# Patient Record
Sex: Male | Born: 1955
Health system: Southern US, Community
[De-identification: ages and names within clinical notes are randomized; demographics above are authoritative.]

## PROBLEM LIST (undated history)

## (undated) DIAGNOSIS — N200 Calculus of kidney: Secondary | ICD-10-CM

## (undated) HISTORY — PX: APPENDECTOMY: SHX54

## (undated) HISTORY — PX: BACK SURGERY: SHX140

---

## 2015-08-31 DIAGNOSIS — R030 Elevated blood-pressure reading, without diagnosis of hypertension: Secondary | ICD-10-CM | POA: Insufficient documentation

## 2015-08-31 DIAGNOSIS — I10 Essential (primary) hypertension: Secondary | ICD-10-CM | POA: Insufficient documentation

## 2017-08-29 DIAGNOSIS — K429 Umbilical hernia without obstruction or gangrene: Secondary | ICD-10-CM | POA: Insufficient documentation

## 2017-08-29 DIAGNOSIS — N200 Calculus of kidney: Secondary | ICD-10-CM | POA: Insufficient documentation

## 2017-09-26 DIAGNOSIS — E782 Mixed hyperlipidemia: Secondary | ICD-10-CM | POA: Insufficient documentation

## 2019-09-01 DIAGNOSIS — Z23 Encounter for immunization: Secondary | ICD-10-CM | POA: Diagnosis not present

## 2019-09-24 DIAGNOSIS — Z23 Encounter for immunization: Secondary | ICD-10-CM | POA: Diagnosis not present

## 2020-04-06 DIAGNOSIS — Z23 Encounter for immunization: Secondary | ICD-10-CM | POA: Diagnosis not present

## 2020-05-16 ENCOUNTER — Encounter: Payer: Self-pay | Admitting: Emergency Medicine

## 2020-05-16 ENCOUNTER — Emergency Department (INDEPENDENT_AMBULATORY_CARE_PROVIDER_SITE_OTHER): Payer: BC Managed Care – PPO

## 2020-05-16 ENCOUNTER — Emergency Department (INDEPENDENT_AMBULATORY_CARE_PROVIDER_SITE_OTHER)
Admission: EM | Admit: 2020-05-16 | Discharge: 2020-05-16 | Disposition: A | Discharge status: Home / Self Care | Payer: BC Managed Care – PPO | Source: Home / Self Care | Attending: Internal Medicine | Admitting: Internal Medicine

## 2020-05-16 ENCOUNTER — Other Ambulatory Visit: Payer: Self-pay

## 2020-05-16 DIAGNOSIS — K5900 Constipation, unspecified: Secondary | ICD-10-CM | POA: Diagnosis not present

## 2020-05-16 DIAGNOSIS — K529 Noninfective gastroenteritis and colitis, unspecified: Secondary | ICD-10-CM

## 2020-05-16 DIAGNOSIS — R1030 Lower abdominal pain, unspecified: Secondary | ICD-10-CM

## 2020-05-16 DIAGNOSIS — R1012 Left upper quadrant pain: Secondary | ICD-10-CM | POA: Diagnosis not present

## 2020-05-16 DIAGNOSIS — R109 Unspecified abdominal pain: Secondary | ICD-10-CM | POA: Diagnosis not present

## 2020-05-16 HISTORY — DX: Calculus of kidney: N20.0

## 2020-05-16 MED ORDER — POLYETHYLENE GLYCOL 3350 17 G PO PACK: 17.0000 g | PACK | Freq: Every day | ORAL | 0 refills | Status: DC | PRN

## 2020-05-16 MED ORDER — METRONIDAZOLE 500 MG PO TABS: 500.0000 mg | ORAL_TABLET | Freq: Three times a day (TID) | ORAL | 0 refills | Status: AC

## 2020-05-16 MED ORDER — SENNOSIDES-DOCUSATE SODIUM 8.6-50 MG PO TABS: 1.0000 | ORAL_TABLET | Freq: Two times a day (BID) | ORAL | 0 refills | Status: DC

## 2020-05-16 MED ORDER — CIPROFLOXACIN HCL 500 MG PO TABS: 500.0000 mg | ORAL_TABLET | Freq: Two times a day (BID) | ORAL | 0 refills | Status: AC

## 2020-05-16 MED ORDER — ONDANSETRON 4 MG PO TBDP: 4.0000 mg | ORAL_TABLET | Freq: Three times a day (TID) | ORAL | 0 refills | Status: DC | PRN

## 2020-05-16 NOTE — ED Provider Notes (Signed)
[name]    CSN: [id number] Arrival date & time: 05/16/20  8756      History   Chief Complaint Chief Complaint  Patient presents with   Flank Pain    HPI Jake Huber is a 64 y.o. male comes to urgent care with left upper quadrant pain of 4 days duration.  Patient describes the pain as sharp/throbbing and localized in the left upper quadrant area.  No known relieving factors.  Last bowel movement was 3 to 4 days ago.  No diarrhea.  Stool was normal consistency.  No abdominal distention.  Patient has had some nausea and nonbilious nonbloody vomiting a couple of times.   Last colonoscopy was several years ago.  HPI  Past Medical History:  Diagnosis Date   Kidney stones     There are no problems to display for this patient.   Past Surgical History:  Procedure Laterality Date   APPENDECTOMY     BACK SURGERY         Home Medications    Prior to Admission medications   Medication Sig Start Date End Date Taking? Authorizing Provider  acetaminophen (TYLENOL) 325 MG tablet Take 650 mg by mouth every 6 (six) hours as needed.    [provider]  ciprofloxacin (CIPRO) 500 MG tablet Take 1 tablet (500 mg total) by mouth every 12 (twelve) hours for 7 days. 05/16/20 05/23/20  Merrilee Jansky, MD  metroNIDAZOLE (FLAGYL) 500 MG tablet Take 1 tablet (500 mg total) by mouth 3 (three) times daily for 7 days. 05/16/20 05/23/20  Merrilee Jansky, MD  ondansetron (ZOFRAN ODT) 4 MG disintegrating tablet Take 1 tablet (4 mg total) by mouth every 8 (eight) hours as needed for nausea or vomiting. 05/16/20   Gali Spinney, Britta Mccreedy, MD  polyethylene glycol (MIRALAX) 17 g packet Take 17 g by mouth daily as needed for moderate constipation. 05/16/20   Christien Berthelot, Britta Mccreedy, MD  senna-docusate (SENOKOT-S) 8.6-50 MG tablet Take 1 tablet by mouth 2 (two) times daily. 05/16/20   Jermya Dowding, Britta Mccreedy, MD    Family History Family History  Problem Relation Age of Onset    Healthy Mother    Cancer Father     Social History Social History   Tobacco Use   Smoking status: Former Smoker   Smokeless tobacco: Never Used  Building services engineer Use: Never used  Substance Use Topics   Alcohol use: Not Currently     Allergies   Patient has no known allergies.   Review of Systems Review of Systems  Constitutional: Negative for chills, fatigue and fever.  Gastrointestinal: Positive for constipation, nausea and vomiting. Negative for abdominal distention and diarrhea.  Genitourinary: Negative for dysuria, frequency and urgency.  Neurological: Negative.      Physical Exam Triage Vital Signs ED Triage Vitals  Enc Vitals Group     BP 05/16/20 0850 (!) 178/99     Pulse Rate 05/16/20 0850 80     Resp 05/16/20 0850 20     Temp 05/16/20 0850 98.8 F (37.1 C)     Temp Source 05/16/20 0850 Oral     SpO2 05/16/20 0850 95 %     Weight 05/16/20 0851 224 lb (101.6 kg)     Height 05/16/20 0851 5\' 11"  (1.803 m)     Head Circumference --      Peak Flow --      Pain Score 05/16/20 0851 8     Pain Loc --  Pain Edu? --      Excl. in GC? --    No data found.  Updated Vital Signs BP (!) 178/99 (BP Location: Right Arm)    Pulse 80    Temp 98.8 F (37.1 C) (Oral)    Resp 20    Ht 5\' 11"  (1.803 m)    Wt 101.6 kg    SpO2 95%    BMI 31.24 kg/m   Visual Acuity Right Eye Distance:   Left Eye Distance:   Bilateral Distance:    Right Eye Near:   Left Eye Near:    Bilateral Near:     Physical Exam Vitals and nursing note reviewed.  Constitutional:      General: He is not in acute distress.    Appearance: He is not ill-appearing.  Cardiovascular:     Rate and Rhythm: Normal rate and regular rhythm.     Pulses: Normal pulses.     Heart sounds: Normal heart sounds.  Pulmonary:     Effort: Pulmonary effort is normal.     Breath sounds: Normal breath sounds.  Abdominal:     General: Bowel sounds are normal. There is no distension.     Tenderness:  There is abdominal tenderness. There is no guarding or rebound.  Neurological:     Mental Status: He is alert.      UC Treatments / Results  Labs (all labs ordered are listed, but only abnormal results are displayed) Labs Reviewed  CBC    EKG   Radiology DG Abdomen 1 View  Result Date: 05/16/2020 CLINICAL DATA:  Left flank/lower abdominal pain EXAM: ABDOMEN - 1 VIEW COMPARISON:  None. FINDINGS: There are presumed phleboliths in the pelvis. No other abnormal calcifications are evident. There is moderate stool in the colon. There is no bowel dilatation or air-fluid level to suggest bowel obstruction. No free air. IMPRESSION: Probable phleboliths in the pelvis. No other abnormal calcifications. No evident bowel obstruction or free air. Electronically Signed   By: 05/18/2020 III M.D.   On: 05/16/2020 10:10    Procedures Procedures (including critical care time)  Medications Ordered in UC Medications - No data to display  Initial Impression / Assessment and Plan / UC Course  I have reviewed the triage vital signs and the nursing notes.  Pertinent labs & imaging results that were available during my care of the patient were reviewed by me and considered in my medical decision making (see chart for details).     1.  Left upper quadrant abdominal pain likely secondary to colitis: CBC shows WBC of 11.5 with a left shift No guarding or rebound tenderness. We will start patient on Cipro and Flagyl for 7 days Colace and Senokot given to relieve moderate constipation seen on KUB Return precautions given  2.  Elevated blood pressure: No headache or chest pain Patient is advised to establish PCP care. Return precautions given. Final Clinical Impressions(s) / UC Diagnoses   Final diagnoses:  Left upper quadrant abdominal pain  Acute colitis  Constipation, unspecified constipation type   Discharge Instructions   None    ED Prescriptions    Medication Sig Dispense  Auth. Provider   senna-docusate (SENOKOT-S) 8.6-50 MG tablet Take 1 tablet by mouth 2 (two) times daily. 60 tablet Athea Haley, 09-09-1979, MD   ciprofloxacin (CIPRO) 500 MG tablet Take 1 tablet (500 mg total) by mouth every 12 (twelve) hours for 7 days. 14 tablet Kamarri Fischetti, Britta Mccreedy, MD   metroNIDAZOLE (FLAGYL)  500 MG tablet Take 1 tablet (500 mg total) by mouth 3 (three) times daily for 7 days. 21 tablet Rosezella Kronick, Britta Mccreedy, MD   ondansetron (ZOFRAN ODT) 4 MG disintegrating tablet Take 1 tablet (4 mg total) by mouth every 8 (eight) hours as needed for nausea or vomiting. 20 tablet Corleen Otwell, Britta Mccreedy, MD   polyethylene glycol (MIRALAX) 17 g packet Take 17 g by mouth daily as needed for moderate constipation. 14 each Jariah Tarkowski, Britta Mccreedy, MD     PDMP not reviewed this encounter.   Merrilee Jansky, MD 05/16/20 1104

## 2020-05-16 NOTE — ED Triage Notes (Signed)
Left flank pain x 4 days, constipation 8/10

## 2020-05-24 ENCOUNTER — Encounter: Payer: Self-pay | Admitting: Family Medicine

## 2020-05-24 ENCOUNTER — Ambulatory Visit (INDEPENDENT_AMBULATORY_CARE_PROVIDER_SITE_OTHER): Payer: BC Managed Care – PPO | Admitting: Family Medicine

## 2020-05-24 VITALS — BP 161/89 | HR 88 | Temp 98.0°F | Wt 230.1 lb

## 2020-05-24 DIAGNOSIS — M21372 Foot drop, left foot: Secondary | ICD-10-CM | POA: Diagnosis not present

## 2020-05-24 DIAGNOSIS — E782 Mixed hyperlipidemia: Secondary | ICD-10-CM | POA: Diagnosis not present

## 2020-05-24 DIAGNOSIS — Z125 Encounter for screening for malignant neoplasm of prostate: Secondary | ICD-10-CM

## 2020-05-24 DIAGNOSIS — R030 Elevated blood-pressure reading, without diagnosis of hypertension: Secondary | ICD-10-CM

## 2020-05-24 DIAGNOSIS — Z0000 Encounter for general adult medical examination without abnormal findings: Secondary | ICD-10-CM

## 2020-05-24 NOTE — Progress Notes (Signed)
Jake Huber - 64 y.o. male MRN [id number]  Date of birth: 1956/05/31  Subjective No chief complaint on file.   HPI Jake Huber is a 64 year old male with history of hyperlipidemia here today for initial visit.  He feels that he has been in fairly good health otherwise.  He is taking red yeast rice and co-Q10 for management of hyperlipidemia.  He has never tried a statin.  He has had elevated blood pressure readings at his previous primary care doctor's office.  He has had ambulatory blood pressure monitoring and blood pressures at home have been well controlled.  He denies any symptoms related to hypertension including chest pain, shortness of breath, palpitations, headache or vision changes.  He does complain of some mild gait difficulty and weakness on the left foot when walking.  He has had some foot drop and finds that if he is not conscious about lifting his leg when walking he tends to trip or stumble.  He reports that this started about 5 to 6 years ago.  He does have history of previous lumbar laminectomy x2.  He is not sure if this correlates to when he was having low back problems in the past.  He has never seen physical therapy for this.  ROS:  A comprehensive ROS was completed and negative except as noted per HPI  No Known Allergies  Past Medical History:  Diagnosis Date  . Kidney stones     Past Surgical History:  Procedure Laterality Date  . APPENDECTOMY    . BACK SURGERY      Social History   Socioeconomic History  . Marital status: Divorced    Spouse name: Not on file  . Number of children: Not on file  . Years of education: Not on file  . Highest education level: Not on file  Occupational History  . Not on file  Tobacco Use  . Smoking status: Former Smoker    Packs/day: 1.50    Years: 25.00    Pack years: 37.50    Types: Cigarettes    Quit date: 06/04/1995    Years since quitting: 24.9  . Smokeless tobacco: Never Used  Vaping Use  . Vaping Use:  Never used  Substance and Sexual Activity  . Alcohol use: Not Currently  . Drug use: Not Currently  . Sexual activity: Not Currently    Partners: Female  Other Topics Concern  . Not on file  Social History Narrative  . Not on file   Social Determinants of Health   Financial Resource Strain: Not on file  Food Insecurity: Not on file  Transportation Needs: Not on file  Physical Activity: Not on file  Stress: Not on file  Social Connections: Not on file    Family History  Problem Relation Age of Onset  . Healthy Mother   . Cancer Father   . Testicular cancer Brother     Health Maintenance  Topic Date Due  . Hepatitis C Screening  Never done  . HIV Screening  Never done  . COLONOSCOPY  Never done  . PNA vac Low Risk Adult (1 of 2 - PCV13) Never done  . TETANUS/TDAP  08/30/2027  . INFLUENZA VACCINE  Completed  . COVID-19 Vaccine  Completed     ----------------------------------------------------------------------------------------------------------------------------------------------------------------------------------------------------------------- Physical Exam BP (!) 161/89 (BP Location: Left Arm, Patient Position: Sitting, Cuff Size: Large)   Pulse 88   Temp 98 F (36.7 C)   Wt 230 lb 1.6 oz (104.4 kg)   SpO2  95%   BMI 32.09 kg/m   Physical Exam Constitutional:      Appearance: Normal appearance.  HENT:     Head: Normocephalic and atraumatic.  Eyes:     General: No scleral icterus. Cardiovascular:     Rate and Rhythm: Normal rate and regular rhythm.  Pulmonary:     Effort: Pulmonary effort is normal.     Breath sounds: Normal breath sounds.  Musculoskeletal:     Cervical back: Neck supple.     Comments: Atrophy of the left anterior tib noted. Mild foot drop noted when walking.  He does have some mild weakness with dorsiflexion on the left compared to the right.  Skin:    General: Skin is warm and dry.  Neurological:     General: No focal deficit  present.     Mental Status: He is alert.  Psychiatric:        Mood and Affect: Mood normal.        Behavior: Behavior normal.     ------------------------------------------------------------------------------------------------------------------------------------------------------------------------------------------------------------------- Assessment and Plan  Mixed hyperlipidemia Currently taking red yeast rice. We will update lipid panel as well as additional labs for upcoming annual exam.  Elevated blood-pressure reading, without diagnosis of hypertension Blood pressure elevated in clinic today.  He reports readings at home have been normal.  Asked him to bring a log of these readings to his upcoming physical next month.  Acquired left foot drop Mild foot drop with atrophy of the anterior tibialis and mild weakness with dorsiflexion. I have placed a referral to physical therapy.  He may benefit from AFO brace as well.   No orders of the defined types were placed in this encounter.   No follow-ups on file.    This visit occurred during the SARS-CoV-2 public health emergency.  Safety protocols were in place, including screening questions prior to the visit, additional usage of staff PPE, and extensive cleaning of exam room while observing appropriate contact time as indicated for disinfecting solutions.

## 2020-05-24 NOTE — Assessment & Plan Note (Signed)
Mild foot drop with atrophy of the anterior tibialis and mild weakness with dorsiflexion. I have placed a referral to physical therapy.  He may benefit from AFO brace as well.

## 2020-05-24 NOTE — Assessment & Plan Note (Signed)
Blood pressure elevated in clinic today.  He reports readings at home have been normal.  Asked him to bring a log of these readings to his upcoming physical next month.

## 2020-05-24 NOTE — Assessment & Plan Note (Signed)
Currently taking red yeast rice. We will update lipid panel as well as additional labs for upcoming annual exam.

## 2020-05-24 NOTE — Patient Instructions (Signed)
Great to meet you today! Please have fasting labs completed 1-2 weeks prior to physical.  I have entered a referral to physical therapy, you will be contacted for appt.

## 2020-06-09 DIAGNOSIS — Z125 Encounter for screening for malignant neoplasm of prostate: Secondary | ICD-10-CM | POA: Diagnosis not present

## 2020-06-09 DIAGNOSIS — E782 Mixed hyperlipidemia: Secondary | ICD-10-CM | POA: Diagnosis not present

## 2020-06-09 DIAGNOSIS — Z0000 Encounter for general adult medical examination without abnormal findings: Secondary | ICD-10-CM | POA: Diagnosis not present

## 2020-06-10 LAB — LIPID PANEL
Cholesterol: 186 mg/dL (ref <200)
HDL: 52 mg/dL (ref >=40)
LDL Cholesterol (Calc): 112 mg/dL (calc) — ABNORMAL HIGH
Non-HDL Cholesterol (Calc): 134 mg/dL (calc) — ABNORMAL HIGH (ref <130)
Total CHOL/HDL Ratio: 3.6 (calc) (ref <5.0)
Triglycerides: 116 mg/dL (ref <150)

## 2020-06-10 LAB — CBC
HCT: 46.6 % (ref 38.5–50.0)
Hemoglobin: 15.7 g/dL (ref 13.2–17.1)
MCH: 30.9 pg (ref 27.0–33.0)
MCHC: 33.7 g/dL (ref 32.0–36.0)
MCV: 91.7 fL (ref 80.0–100.0)
MPV: 9.5 fL (ref 7.5–12.5)
Platelets: 251 10*3/uL (ref 140–400)
RBC: 5.08 10*6/uL (ref 4.20–5.80)
RDW: 12.5 % (ref 11.0–15.0)
WBC: 5.5 10*3/uL (ref 3.8–10.8)

## 2020-06-10 LAB — COMPLETE METABOLIC PANEL WITH GFR
AG Ratio: 1.6 (calc) (ref 1.0–2.5)
ALT: 23 U/L (ref 9–46)
AST: 25 U/L (ref 10–35)
Albumin: 4.2 g/dL (ref 3.6–5.1)
Alkaline phosphatase (APISO): 53 U/L (ref 35–144)
BUN: 18 mg/dL (ref 7–25)
CO2: 25 mmol/L (ref 20–32)
Calcium: 9.5 mg/dL (ref 8.6–10.3)
Chloride: 105 mmol/L (ref 98–110)
Creat: 1.05 mg/dL (ref 0.70–1.25)
GFR, Est African American: 86 mL/min/{1.73_m2} (ref >=60)
GFR, Est Non African American: 74 mL/min/{1.73_m2} (ref >=60)
Globulin: 2.6 g/dL (calc) (ref 1.9–3.7)
Glucose, Bld: 99 mg/dL (ref 65–99)
Potassium: 4.8 mmol/L (ref 3.5–5.3)
Sodium: 139 mmol/L (ref 135–146)
Total Bilirubin: 0.6 mg/dL (ref 0.2–1.2)
Total Protein: 6.8 g/dL (ref 6.1–8.1)

## 2020-06-10 LAB — PSA: PSA: 2.65 ng/mL (ref <=4.0)

## 2020-06-15 ENCOUNTER — Other Ambulatory Visit: Payer: Self-pay

## 2020-06-15 ENCOUNTER — Ambulatory Visit (INDEPENDENT_AMBULATORY_CARE_PROVIDER_SITE_OTHER): Payer: BC Managed Care – PPO | Admitting: Family Medicine

## 2020-06-15 ENCOUNTER — Encounter: Payer: Self-pay | Admitting: Family Medicine

## 2020-06-15 VITALS — BP 155/90 | HR 89 | Ht 71.0 in | Wt 230.0 lb

## 2020-06-15 DIAGNOSIS — Z23 Encounter for immunization: Secondary | ICD-10-CM

## 2020-06-15 DIAGNOSIS — Z0000 Encounter for general adult medical examination without abnormal findings: Secondary | ICD-10-CM

## 2020-06-15 NOTE — Patient Instructions (Signed)
Preventive Care 66 Years and Older, Male Preventive care refers to lifestyle choices and visits with your health care provider that can promote health and wellness. This includes:  A yearly physical exam. This is also called an annual wellness visit.  Regular dental and eye exams.  Immunizations.  Screening for certain conditions.  Healthy lifestyle choices, such as: ? Eating a healthy diet. ? Getting regular exercise. ? Not using drugs or products that contain nicotine and tobacco. ? Limiting alcohol use. What can I expect for my preventive care visit? Physical exam Your health care provider will check your:  Height and weight. These may be used to calculate your BMI (body mass index). BMI is a measurement that tells if you are at a healthy weight.  Heart rate and blood pressure.  Body temperature.  Skin for abnormal spots. Counseling Your health care provider may ask you questions about your:  Past medical problems.  Family's medical history.  Alcohol, tobacco, and drug use.  Emotional well-being.  Home life and relationship well-being.  Sexual activity.  Diet, exercise, and sleep habits.  History of falls.  Memory and ability to understand (cognition).  Work and work environment.  Access to firearms. What immunizations do I need? Vaccines are usually given at various ages, according to a schedule. Your health care provider will recommend vaccines for you based on your age, medical history, and lifestyle or other factors, such as travel or where you work.   What tests do I need? Blood tests  Lipid and cholesterol levels. These may be checked every 5 years, or more often depending on your overall health.  Hepatitis C test.  Hepatitis B test. Screening  Lung cancer screening. You may have this screening every year starting at age 55 if you have a 30-pack-year history of smoking and currently smoke or have quit within the past 15 years.  Colorectal  cancer screening. ? All adults should have this screening starting at age 50 and continuing until age 75. ? Your health care provider may recommend screening at age 45 if you are at increased risk. ? You will have tests every 1-10 years, depending on your results and the type of screening test.  Prostate cancer screening. Recommendations will vary depending on your family history and other risks.  Genital exam to check for testicular cancer or hernias.  Diabetes screening. ? This is done by checking your blood sugar (glucose) after you have not eaten for a while (fasting). ? You may have this done every 1-3 years.  Abdominal aortic aneurysm (AAA) screening. You may need this if you are a current or former smoker.  STD (sexually transmitted disease) testing, if you are at risk. Follow these instructions at home: Eating and drinking  Eat a diet that includes fresh fruits and vegetables, whole grains, lean protein, and low-fat dairy products. Limit your intake of foods with high amounts of sugar, saturated fats, and salt.  Take vitamin and mineral supplements as recommended by your health care provider.  Do not drink alcohol if your health care provider tells you not to drink.  If you drink alcohol: ? Limit how much you have to 0-2 drinks a day. ? Be aware of how much alcohol is in your drink. In the [place] one drink equals one 12 oz bottle of beer (355 mL), one 5 oz glass of wine (148 mL), or one 1 oz glass of hard liquor (44 mL).   Lifestyle  Take daily care of your teeth   and gums. Brush your teeth every morning and night with fluoride toothpaste. Floss one time each day.  Stay active. Exercise for at least 30 minutes 5 or more days each week.  Do not use any products that contain nicotine or tobacco, such as cigarettes, e-cigarettes, and chewing tobacco. If you need help quitting, ask your health care provider.  Do not use drugs.  If you are sexually active, practice safe sex.  Use a condom or other form of protection to prevent STIs (sexually transmitted infections).  Talk with your health care provider about taking a low-dose aspirin or statin.  Find healthy ways to cope with stress, such as: ? Meditation, yoga, or listening to music. ? Journaling. ? Talking to a trusted person. ? Spending time with friends and family. Safety  Always wear your seat belt while driving or riding in a vehicle.  Do not drive: ? If you have been drinking alcohol. Do not ride with someone who has been drinking. ? When you are tired or distracted. ? While texting.  Wear a helmet and other protective equipment during sports activities.  If you have firearms in your house, make sure you follow all gun safety procedures. What's next?  Visit your health care provider once a year for an annual wellness visit.  Ask your health care provider how often you should have your eyes and teeth checked.  Stay up to date on all vaccines. This information is not intended to replace advice given to you by your health care provider. Make sure you discuss any questions you have with your health care provider. Document Revised: 02/16/2019 Document Reviewed: 05/14/2018 Elsevier Patient Education  2021 Elsevier Inc.  

## 2020-06-15 NOTE — Progress Notes (Signed)
Jake Huber - 65 y.o. male MRN [id number]  Date of birth: 06-Mar-1956  Subjective No chief complaint on file.   HPI Jake Huber is a 65 year old male here today for annual exam.  He has history of hyperlipidemia.  He had labs drawn prior to visit today.  I reviewed these with him at today's visit.  These were all normal except for elevated LDL of 112.  He prefers not to start medication at this time.  He has having some pain in the right foot.  Similar to previous episodes of plantar fasciitis.  Denies numbness or tingling.  He does walk quite a bit at work but does not do a whole lot more in regards to exercise outside of this. He feels like his diet is pretty good.  He is a former smoker and quit in 1997.  He does not consume alcohol.  He is due for colon cancer screening.  He has never had pneumonia vaccine.  Review of Systems  Constitutional: Negative for chills, fever, malaise/fatigue and weight loss.  HENT: Negative for congestion, ear pain and sore throat.   Eyes: Negative for blurred vision, double vision and pain.  Respiratory: Negative for cough and shortness of breath.   Cardiovascular: Negative for chest pain and palpitations.  Gastrointestinal: Negative for abdominal pain, blood in stool, constipation, heartburn and nausea.  Genitourinary: Negative for dysuria and urgency.  Musculoskeletal: Negative for joint pain and myalgias.  Neurological: Negative for dizziness and headaches.  Endo/Heme/Allergies: Does not bruise/bleed easily.  Psychiatric/Behavioral: Negative for depression. The patient is not nervous/anxious and does not have insomnia.     No Known Allergies  Past Medical History:  Diagnosis Date  . Kidney stones     Past Surgical History:  Procedure Laterality Date  . APPENDECTOMY    . BACK SURGERY      Social History   Socioeconomic History  . Marital status: Divorced    Spouse name: Not on file  . Number of children: Not on file  .  Years of education: Not on file  . Highest education level: Not on file  Occupational History  . Not on file  Tobacco Use  . Smoking status: Former Smoker    Packs/day: 1.50    Years: 25.00    Pack years: 37.50    Types: Cigarettes    Quit date: 06/04/1995    Years since quitting: 25.0  . Smokeless tobacco: Never Used  Vaping Use  . Vaping Use: Never used  Substance and Sexual Activity  . Alcohol use: Not Currently  . Drug use: Not Currently  . Sexual activity: Not Currently    Partners: Female  Other Topics Concern  . Not on file  Social History Narrative  . Not on file   Social Determinants of Health   Financial Resource Strain: Not on file  Food Insecurity: Not on file  Transportation Needs: Not on file  Physical Activity: Not on file  Stress: Not on file  Social Connections: Not on file    Family History  Problem Relation Age of Onset  . Healthy Mother   . Cancer Father   . Testicular cancer Brother     Health Maintenance  Topic Date Due  . Hepatitis C Screening  Never done  . HIV Screening  Never done  . COLONOSCOPY (Pts 45-38yrs Insurance coverage will need to be confirmed)  Never done  . PNA vac Low Risk Adult (2 of 2 - PPSV23) 06/15/2021  . TETANUS/TDAP  08/30/2027  .  INFLUENZA VACCINE  Completed  . COVID-19 Vaccine  Completed     ----------------------------------------------------------------------------------------------------------------------------------------------------------------------------------------------------------------- Physical Exam BP (!) 155/90   Pulse 89   Ht 5\' 11"  (1.803 m)   Wt 230 lb (104.3 kg)   SpO2 95%   BMI 32.08 kg/m   Physical Exam Constitutional:      General: He is not in acute distress.    Appearance: He is well-nourished.  HENT:     Head: Normocephalic and atraumatic.     Right Ear: External ear normal.     Left Ear: External ear normal.     Mouth/Throat:     Mouth: Oropharynx is clear and moist.  Eyes:      General: No scleral icterus. Neck:     Thyroid: No thyromegaly.  Cardiovascular:     Rate and Rhythm: Normal rate and regular rhythm.     Pulses: Intact distal pulses.     Heart sounds: Normal heart sounds.  Pulmonary:     Effort: Pulmonary effort is normal.     Breath sounds: Normal breath sounds.  Abdominal:     General: Bowel sounds are normal. There is no distension.     Palpations: Abdomen is soft.     Tenderness: There is no abdominal tenderness. There is no guarding.  Musculoskeletal:        General: No edema.     Cervical back: Normal range of motion.  Lymphadenopathy:     Cervical: No cervical adenopathy.  Skin:    General: Skin is warm and dry.     Findings: No rash.  Neurological:     Mental Status: He is alert and oriented to person, place, and time.     Cranial Nerves: No cranial nerve deficit.     Motor: No abnormal muscle tone.  Psychiatric:        Mood and Affect: Mood and affect normal.        Behavior: Behavior normal.     ------------------------------------------------------------------------------------------------------------------------------------------------------------------------------------------------------------------- Assessment and Plan  Well adult exam Well adult Recent labs reviewed with him today.  LDL is mildly elevated.  10 year ascvd score: 16.2%.  He declines to start statin at this time. Immunizations: Prevnar 13 given today. Screenings: Referral for colon cancer screening placed. Anticipatory guidance/risk factor reduction: Counseled on low-sodium diet and weight loss.  Additional recommendations per AVS.       No orders of the defined types were placed in this encounter.   No follow-ups on file.    This visit occurred during the SARS-CoV-2 public health emergency.  Safety protocols were in place, including screening questions prior to the visit, additional usage of staff PPE, and extensive cleaning of exam room while  observing appropriate contact time as indicated for disinfecting solutions.

## 2020-06-15 NOTE — Assessment & Plan Note (Signed)
Well adult Recent labs reviewed with him today.  LDL is mildly elevated.  10 year ascvd score: 16.2%.  He declines to start statin at this time. Immunizations: Prevnar 13 given today. Screenings: Referral for colon cancer screening placed. Anticipatory guidance/risk factor reduction: Counseled on low-sodium diet and weight loss.  Additional recommendations per AVS.

## 2020-06-23 ENCOUNTER — Other Ambulatory Visit: Payer: Self-pay

## 2020-06-23 ENCOUNTER — Encounter: Payer: Self-pay | Admitting: Physical Therapy

## 2020-06-23 ENCOUNTER — Ambulatory Visit (INDEPENDENT_AMBULATORY_CARE_PROVIDER_SITE_OTHER): Payer: BC Managed Care – PPO | Admitting: Physical Therapy

## 2020-06-23 DIAGNOSIS — M6281 Muscle weakness (generalized): Secondary | ICD-10-CM | POA: Diagnosis not present

## 2020-06-23 DIAGNOSIS — R262 Difficulty in walking, not elsewhere classified: Secondary | ICD-10-CM

## 2020-06-23 NOTE — Patient Instructions (Signed)
Access Code: [email or web address] URL: [email or web address] Date: 06/23/2020 Prepared by: Reggy Eye  Exercises Seated Toe Raise - 1 x daily - 7 x weekly - 3 sets - 10 reps Ankle Dorsiflexion with Resistance - 1 x daily - 7 x weekly - 3 sets - 10 reps Toe Raises with Counter Support - 1 x daily - 7 x weekly - 3 sets - 10 reps

## 2020-06-23 NOTE — Therapy (Signed)
[facility] [place] [address] [place], [place], [address] Phone: [phone]   Fax:  [phone]  Physical Therapy Evaluation  Patient Details  Name: Jake Huber MRN: [id number] Date of Birth: 12-28-1955 Referring Provider (PT): Jake Huber   Encounter Date: 06/23/2020   PT End of Session - 06/23/20 1446    Visit Number 1    Number of Visits 1    PT Start Time 1400    PT Stop Time 1445    PT Time Calculation (min) 45 min    Activity Tolerance Patient tolerated treatment well    Behavior During Therapy [facility] for tasks assessed/performed           Past Medical History:  Diagnosis Date   Kidney stones     Past Surgical History:  Procedure Laterality Date   APPENDECTOMY     BACK SURGERY      There were no vitals filed for this visit.    Subjective Assessment - 06/23/20 1403    Subjective Pt states he has had 2 lower lumbar laminectomies in the early 1990s and since then has had Lt foot drop that "comes and goes". Pt states his back problems are mostly resolved but continues to have foot drop. Foot drop is "annoying" and pt has to focus on walking at all times.    Limitations Walking    Patient Stated Goals walk without focusing on Lt foot    Currently in Pain? No/denies              [facility] PT Assessment - 06/23/20 0001      Assessment   Medical Diagnosis aquired foot drop    Referring Provider (PT) Jake Huber      Balance Screen   Has the patient fallen in the past 6 months Yes    How many times? 1    Has the patient had a decrease in activity level because of a fear of falling?  No    Is the patient reluctant to leave their home because of a fear of falling?  No      Prior Function   Level of Independence Independent    Vocation Full time employment    Vocation Requirements walking, standing      ROM / Strength   AROM / PROM / Strength AROM;Strength      AROM   AROM Assessment Site  Ankle    Right/Left Ankle Right;Left    Right Ankle Dorsiflexion 50    Left Ankle Dorsiflexion 30    Left Ankle Inversion 30    Left Ankle Eversion 20      Strength   Overall Strength Comments bilat knee and hip strength WFL    Strength Assessment Site Ankle    Right/Left Ankle Right;Left    Right Ankle Dorsiflexion 5/5    Right Ankle Plantar Flexion 5/5    Left Ankle Dorsiflexion 3/5    Left Ankle Plantar Flexion 4+/5      Flexibility   Soft Tissue Assessment /Muscle Length yes      Ambulation/Gait   Gait Pattern Poor foot clearance - left                      Objective measurements completed on examination: See above findings.       [facility] Adult PT Treatment/Exercise - 06/23/20 0001      Exercises   Exercises Ankle      Modalities   Modalities Electrical  Stimulation      Programme researcher, broadcasting/film/video Location Lt anterior tibialis    Electrical Stimulation Action NMES    Electrical Stimulation Parameters to tolerance and mm contraction    Electrical Stimulation Goals Strength      Ankle Exercises: Standing   Toe Raise 10 reps      Ankle Exercises: Seated   Toe Raise 10 reps      Ankle Exercises: Supine   T-Band ankle DF with red TB                  PT Education - 06/23/20 1445    Education Details HEP, foot up brace, NMES    Person(s) Educated Patient    Methods Explanation;Demonstration;Handout    Comprehension Verbalized understanding;Returned demonstration                       Plan - 06/23/20 1446    Clinical Impression Statement Pt presents with Lt anterior tibialis atrophy, decreased Lt ankle strength and ROM. PT educated pt on recommendation for foot up brace and HEP for DF strengthening. Pt with no further PT needs at this time.    Personal Factors and Comorbidities Time since onset of injury/illness/exacerbation;Past/Current Experience;Comorbidity 2    Examination-Activity Limitations  Locomotion Level;Stairs    Examination-Participation Restrictions Occupation    Stability/Clinical Decision Making Stable/Uncomplicated    Clinical Decision Making Moderate    Rehab Potential Good    PT Frequency One time visit    PT Treatment/Interventions Therapeutic exercise;Electrical Stimulation;Patient/family education    PT Next Visit Plan eval only    Consulted and Agree with Plan of Care Patient           Patient will benefit from skilled therapeutic intervention in order to improve the following deficits and impairments:  Abnormal gait,Difficulty walking,Decreased strength  Visit Diagnosis: Difficulty in walking, not elsewhere classified - Plan: PT plan of care cert/re-cert  Muscle weakness (generalized) - Plan: PT plan of care cert/re-cert     Problem List Patient Active Problem List   Diagnosis Date Noted   Well adult exam 06/15/2020   Acquired left foot drop 05/24/2020   Mixed hyperlipidemia 09/26/2017   Nephrolithiasis 08/29/2017   Umbilical hernia without obstruction and without gangrene 08/29/2017   Elevated blood-pressure reading, without diagnosis of hypertension 08/31/2015   Jake Huber, PT  Jake Huber 06/23/2020, 2:53 PM  [facility] [address] [place], [place], [address] Phone: [phone]   Fax:  [phone]  Name: Jake Huber MRN: [id number] Date of Birth: 19-Oct-1955

## 2021-04-20 DIAGNOSIS — R030 Elevated blood-pressure reading, without diagnosis of hypertension: Secondary | ICD-10-CM | POA: Diagnosis not present

## 2021-04-20 DIAGNOSIS — R051 Acute cough: Secondary | ICD-10-CM | POA: Diagnosis not present

## 2021-04-20 DIAGNOSIS — R059 Cough, unspecified: Secondary | ICD-10-CM | POA: Diagnosis not present

## 2021-10-12 IMAGING — DX DG ABDOMEN 1V
3 series · 3 of 3 positions shown · non-contrast
Comparison: None.

CLINICAL DATA: Left flank/lower abdominal pain

EXAM:
ABDOMEN - 1 VIEW

[abdomen kub (1 of 3)]
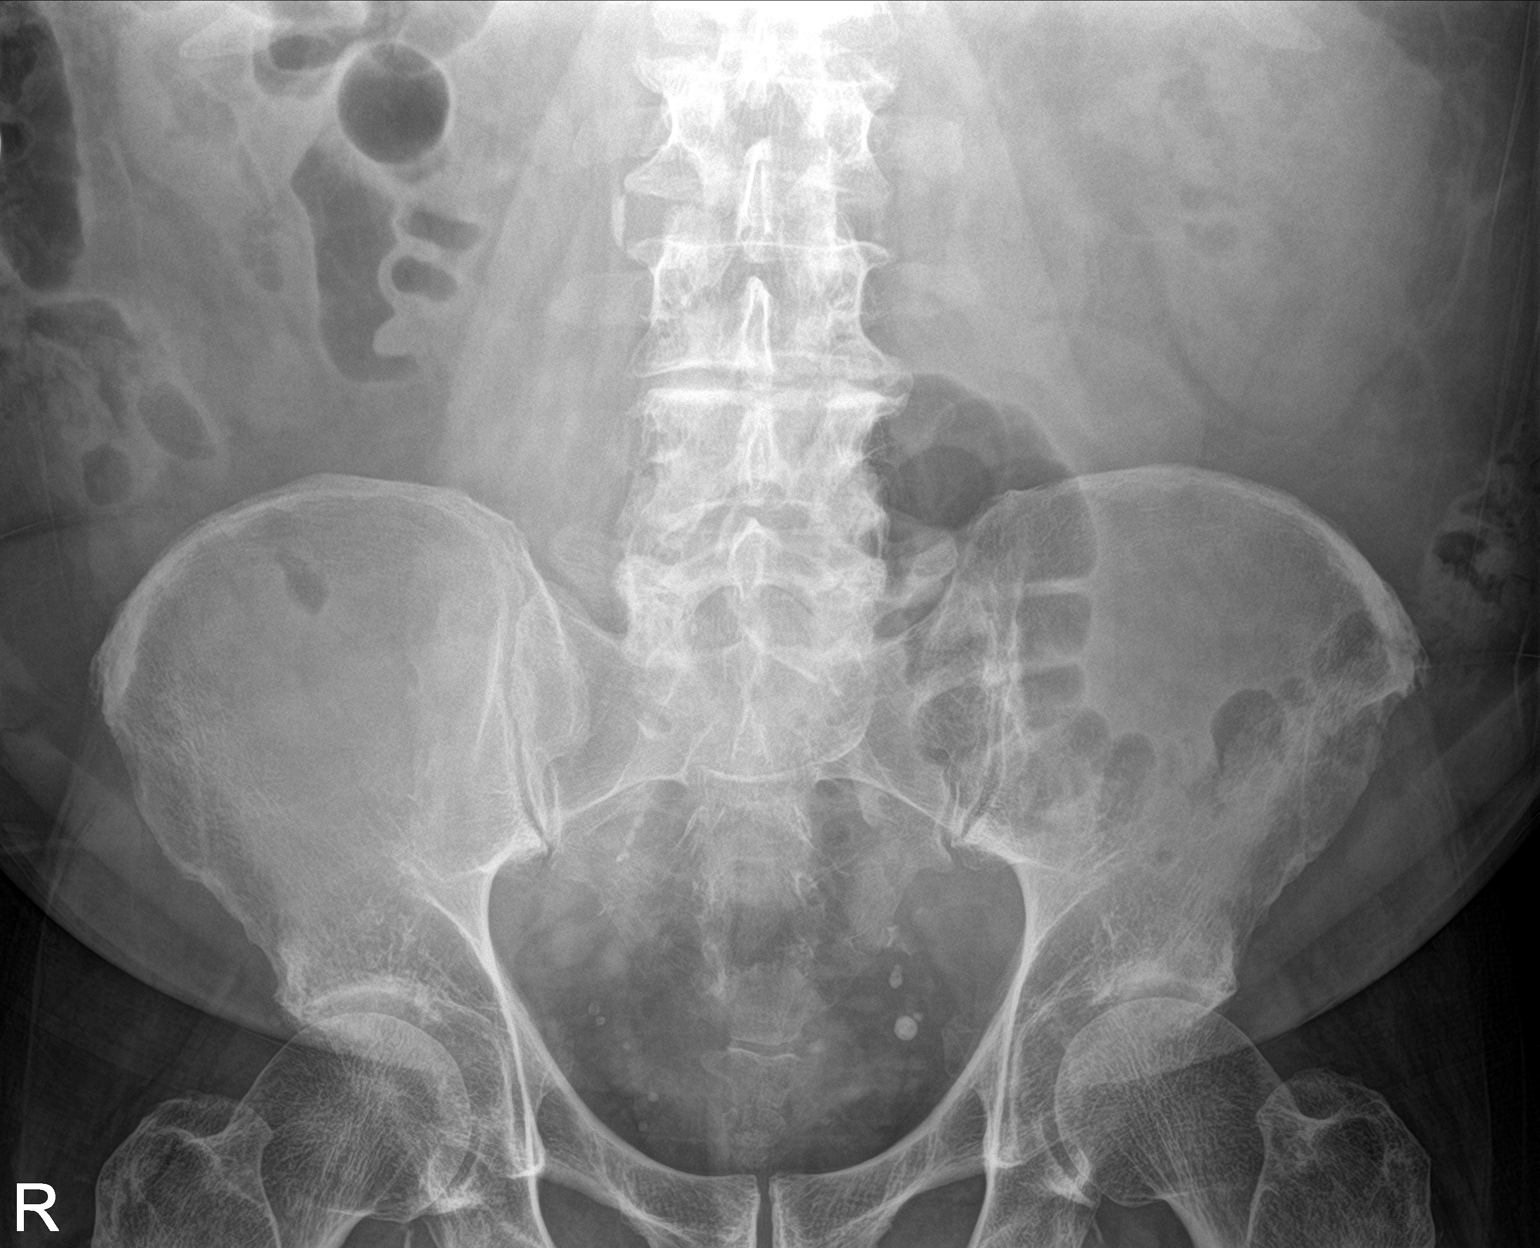

[abdomen kub (2 of 3)]
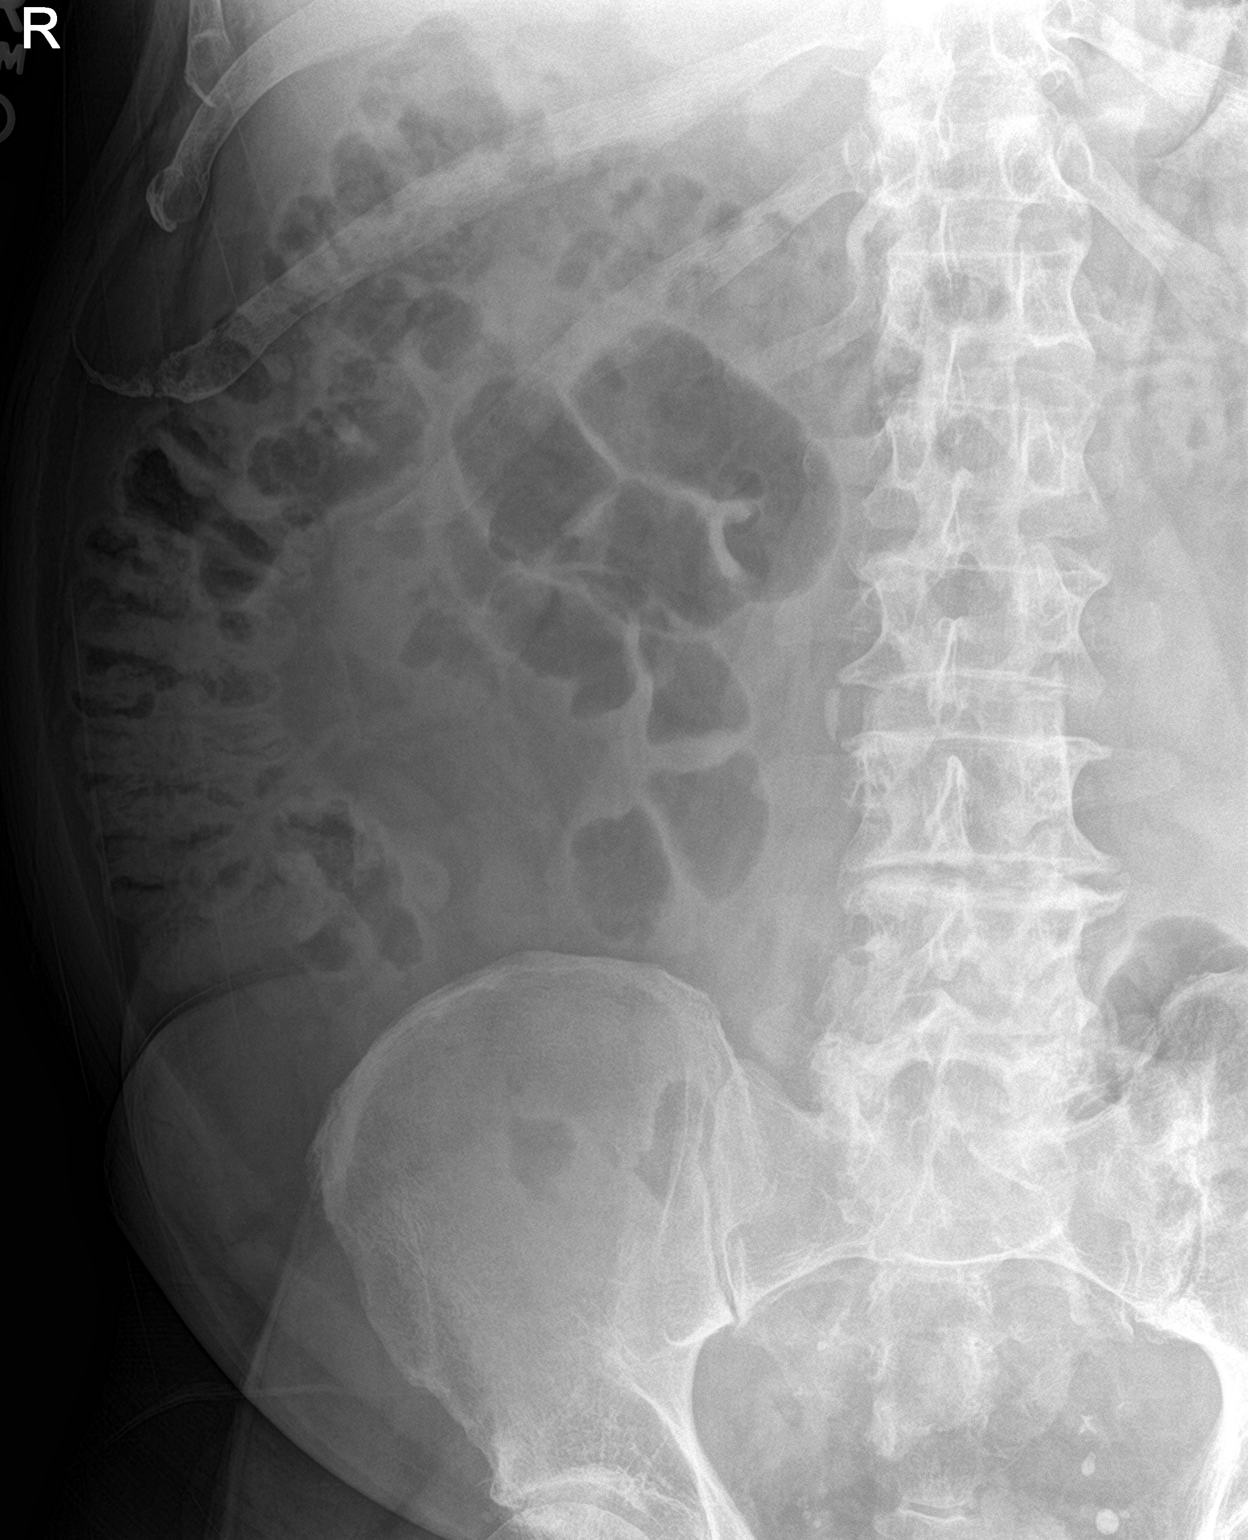

[abdomen kub (3 of 3)]
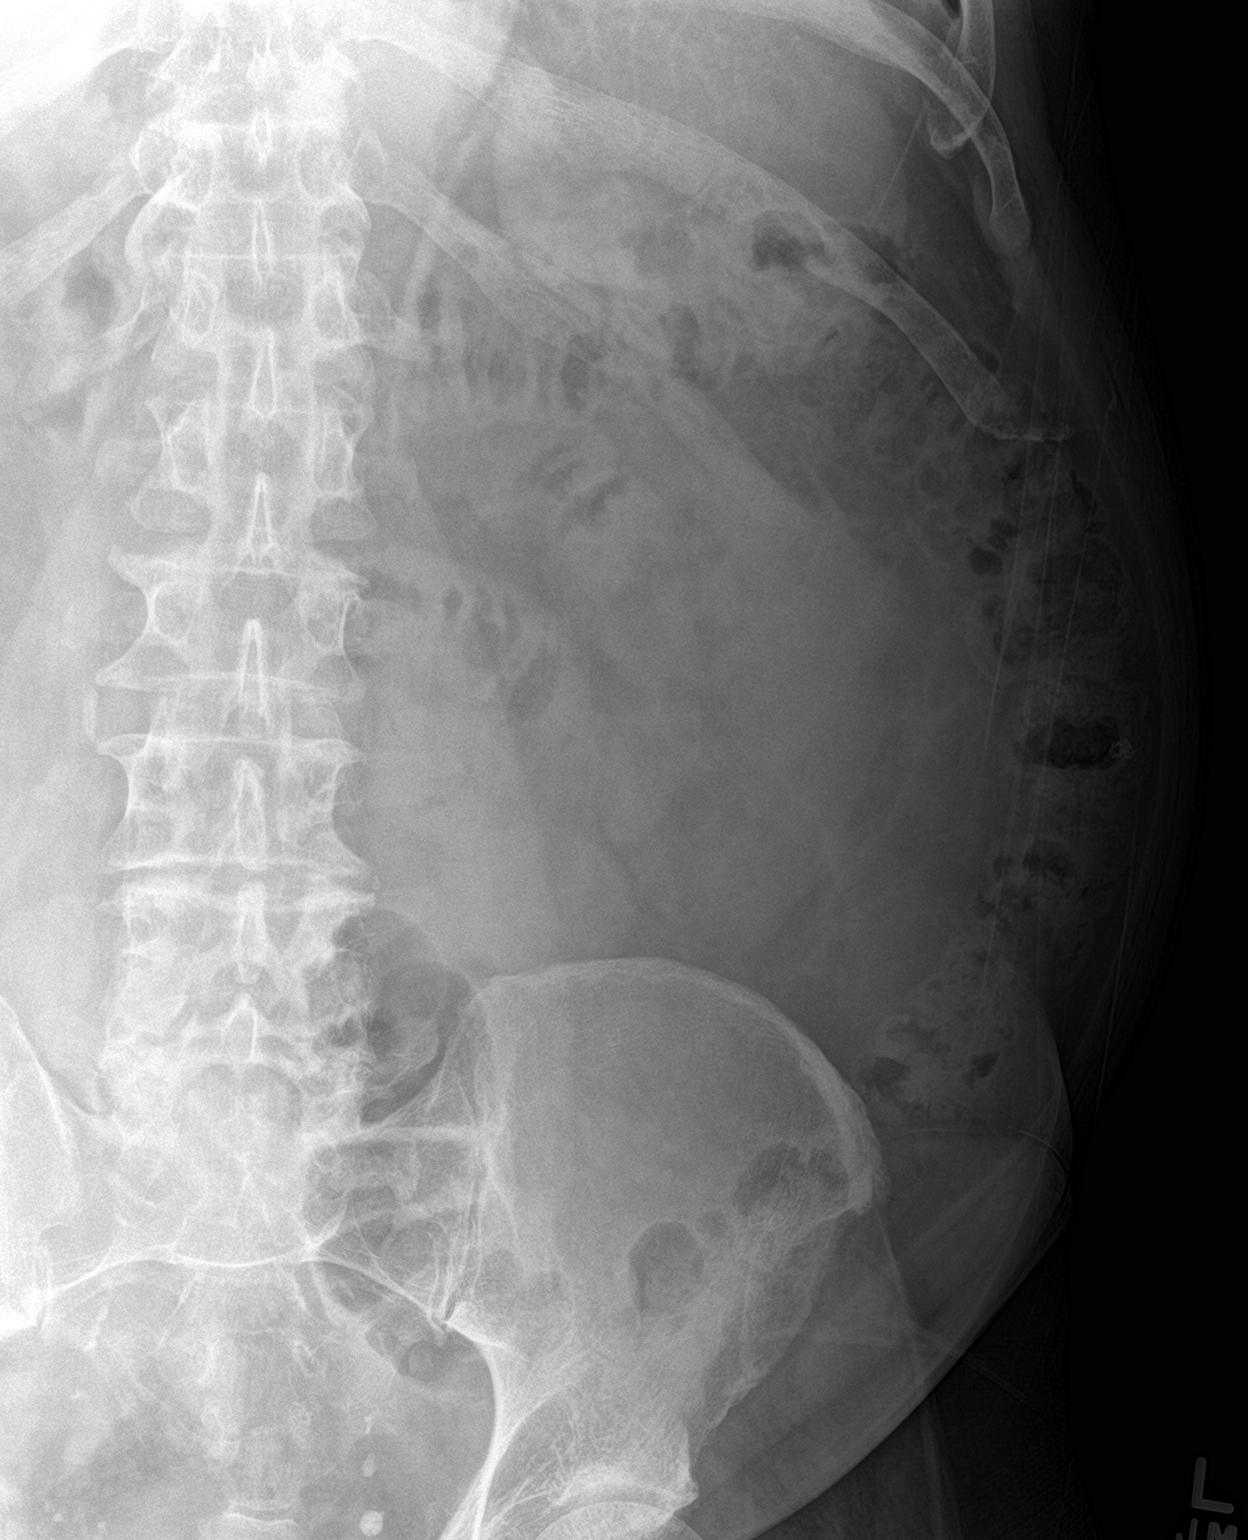

[3 of 3 positions shown; findings below may reference images not displayed]

FINDINGS: There are presumed phleboliths in the pelvis. No other abnormal
calcifications are evident. There is moderate stool in the colon.
There is no bowel dilatation or air-fluid level to suggest bowel
obstruction. No free air.
IMPRESSION: Probable phleboliths in the pelvis. No other abnormal
calcifications. No evident bowel obstruction or free air.

## 2022-08-20 ENCOUNTER — Ambulatory Visit (INDEPENDENT_AMBULATORY_CARE_PROVIDER_SITE_OTHER): Payer: BC Managed Care – PPO | Admitting: Family Medicine

## 2022-08-20 ENCOUNTER — Encounter: Payer: Self-pay | Admitting: Family Medicine

## 2022-08-20 VITALS — BP 162/91 | HR 92 | Ht 69.69 in | Wt 226.9 lb

## 2022-08-20 DIAGNOSIS — Z1211 Encounter for screening for malignant neoplasm of colon: Secondary | ICD-10-CM

## 2022-08-20 DIAGNOSIS — Z23 Encounter for immunization: Secondary | ICD-10-CM

## 2022-08-20 DIAGNOSIS — Z0000 Encounter for general adult medical examination without abnormal findings: Secondary | ICD-10-CM

## 2022-08-20 DIAGNOSIS — I10 Essential (primary) hypertension: Secondary | ICD-10-CM

## 2022-08-20 DIAGNOSIS — Z125 Encounter for screening for malignant neoplasm of prostate: Secondary | ICD-10-CM

## 2022-08-20 DIAGNOSIS — L570 Actinic keratosis: Secondary | ICD-10-CM | POA: Diagnosis not present

## 2022-08-20 DIAGNOSIS — E782 Mixed hyperlipidemia: Secondary | ICD-10-CM

## 2022-08-20 MED ORDER — VALSARTAN 80 MG PO TABS: 80.0000 mg | ORAL_TABLET | Freq: Every day | ORAL | 3 refills | Status: DC

## 2022-08-20 NOTE — Assessment & Plan Note (Signed)
Updated lipid panel order.

## 2022-08-20 NOTE — Assessment & Plan Note (Signed)
Well adult Orders Placed This Encounter  Procedures   COMPLETE METABOLIC PANEL WITH GFR   CBC with Differential   Lipid Panel w/reflex Direct LDL   PSA   Ambulatory referral to Gastroenterology    Referral Priority:   Routine    Referral Type:   Consultation    Referral Reason:   Specialty Services Required    Number of Visits Requested:   1  Screenings: per lab orders. Immunizations:  Prevnar 20 and Shingrix.  Anticipatory guidance/Risk factor reduction:  recommendations per AVS.

## 2022-08-20 NOTE — Assessment & Plan Note (Signed)
Area to left temple treated with liquid nitrogen.  He tolerated this well.

## 2022-08-20 NOTE — Assessment & Plan Note (Signed)
He has had consistently high readings.  Starting valsartan 80mg  daily.  Return in 2 weeks for BP check, repeat BMP

## 2022-08-20 NOTE — Progress Notes (Signed)
Well Jake Huber - 67 y.o. male MRN [id number]  Date of birth: Mar 11, 1956  Subjective Chief Complaint  Patient presents with   Annual Exam    HPI Jake Huber is a 67 y.o. male here today for annual exam.   I have not seen him in a couple of years..  He denies any changes to his medical history since last visit.   Diet is pretty good.  His activity level is moderate, he does rowing about 3 times per week.  Quit smoking in 1997.    He is due for Prevnar 20 and Shingrix.   He is due for colon cancer screening.    Review of Systems  Constitutional:  Negative for chills, fever, malaise/fatigue and weight loss.  HENT:  Negative for congestion, ear pain and sore throat.   Eyes:  Negative for blurred vision, double vision and pain.  Respiratory:  Negative for cough and shortness of breath.   Cardiovascular:  Negative for chest pain and palpitations.  Gastrointestinal:  Negative for abdominal pain, blood in stool, constipation, heartburn and nausea.  Genitourinary:  Negative for dysuria and urgency.  Musculoskeletal:  Negative for joint pain and myalgias.  Neurological:  Negative for dizziness and headaches.  Endo/Heme/Allergies:  Does not bruise/bleed easily.  Psychiatric/Behavioral:  Negative for depression. The patient is not nervous/anxious and does not have insomnia.     No Known Allergies  Past Medical History:  Diagnosis Date   Kidney stones     Past Surgical History:  Procedure Laterality Date   APPENDECTOMY     BACK SURGERY      Social History   Socioeconomic History   Marital status: Divorced    Spouse name: Not on file   Number of children: Not on file   Years of education: Not on file   Highest education level: Not on file  Occupational History   Not on file  Tobacco Use   Smoking status: Former    Packs/day: 1.50    Years: 25.00    Additional pack years: 0.00    Total pack years: 37.50    Types: Cigarettes    Quit date: 06/04/1995    Years  since quitting: 27.2   Smokeless tobacco: Never  Vaping Use   Vaping Use: Never used  Substance and Sexual Activity   Alcohol use: Not Currently   Drug use: Not Currently   Sexual activity: Not Currently    Partners: Female  Other Topics Concern   Not on file  Social History Narrative   Not on file   Social Determinants of Health   Financial Resource Strain: Not on file  Food Insecurity: Not on file  Transportation Needs: Not on file  Physical Activity: Not on file  Stress: Not on file  Social Connections: Not on file    Family History  Problem Relation Age of Onset   Healthy Mother    Cancer Father    Testicular cancer Brother     Health Maintenance  Topic Date Due   Hepatitis C Screening  Never done   COLONOSCOPY (Pts 45-30yrs Insurance coverage will need to be confirmed)  08/20/2023 (Originally 01/30/2000)   COVID-19 Vaccine (6 - 2023-24 season) 09/05/2023 (Originally 07/05/2022)   Zoster Vaccines- Shingrix (2 of 2) 10/15/2022   DTaP/Tdap/Td (3 - Td or Tdap) 08/30/2027   Pneumonia Vaccine 25+ Years old  Completed   INFLUENZA VACCINE  Completed   HPV VACCINES  Aged Out     ----------------------------------------------------------------------------------------------------------------------------------------------------------------------------------------------------------------- Physical Exam BP Marland Kitchen)  162/91 (BP Location: Left Arm, Patient Position: Sitting, Cuff Size: Large)   Pulse 92   Ht 5' 9.69" (1.77 m)   Wt 226 lb 14.4 oz (102.9 kg)   SpO2 94%   BMI 32.85 kg/m   Physical Exam Constitutional:      General: He is not in acute distress. HENT:     Head: Normocephalic and atraumatic.     Right Ear: Tympanic membrane and external ear normal.     Left Ear: Tympanic membrane and external ear normal.  Eyes:     General: No scleral icterus. Neck:     Thyroid: No thyromegaly.  Cardiovascular:     Rate and Rhythm: Normal rate and regular rhythm.     Heart  sounds: Normal heart sounds.  Pulmonary:     Effort: Pulmonary effort is normal.     Breath sounds: Normal breath sounds.  Abdominal:     General: Bowel sounds are normal. There is no distension.     Palpations: Abdomen is soft.     Tenderness: There is no abdominal tenderness. There is no guarding.  Musculoskeletal:     Cervical back: Normal range of motion.  Lymphadenopathy:     Cervical: No cervical adenopathy.  Skin:    General: Skin is warm and dry.     Findings: No rash.     Comments: Dry, hyperkeratotic patch over the left temple area consistent with actinic keratosis.  Neurological:     Mental Status: He is alert and oriented to person, place, and time.     Cranial Nerves: No cranial nerve deficit.     Motor: No abnormal muscle tone.  Psychiatric:        Mood and Affect: Mood normal.        Behavior: Behavior normal.     ------------------------------------------------------------------------------------------------------------------------------------------------------------------------------------------------------------------- Assessment and Plan  Essential hypertension He has had consistently high readings.  Starting valsartan 80mg  daily.  Return in 2 weeks for BP check, repeat BMP  Mixed hyperlipidemia Updated lipid panel order.   Well adult exam Well adult Orders Placed This Encounter  Procedures   COMPLETE METABOLIC PANEL WITH GFR   CBC with Differential   Lipid Panel w/reflex Direct LDL   PSA   Ambulatory referral to Gastroenterology    Referral Priority:   Routine    Referral Type:   Consultation    Referral Reason:   Specialty Services Required    Number of Visits Requested:   1  Screenings: per lab orders. Immunizations:  Prevnar 20 and Shingrix.  Anticipatory guidance/Risk factor reduction:  recommendations per AVS.   Actinic keratosis Area to left temple treated with liquid nitrogen.  He tolerated this well.   Meds ordered this encounter   Medications   valsartan (DIOVAN) 80 MG tablet    Sig: Take 1 tablet (80 mg total) by mouth daily.    Dispense:  90 tablet    Refill:  3    Return in about 6 months (around 02/20/2023) for HTN/Shingrix #2.    This visit occurred during the SARS-CoV-2 public health emergency.  Safety protocols were in place, including screening questions prior to the visit, additional usage of staff PPE, and extensive cleaning of exam room while observing appropriate contact time as indicated for disinfecting solutions.

## 2022-08-20 NOTE — Patient Instructions (Addendum)
Start Valsartan 80mg  for blood pressure Follow up in 2 weeks for a nurse visit to check blood pressure and recheck potassium levels.      Preventive Care 69 Years and Older, Male Preventive care refers to lifestyle choices and visits with your health care provider that can promote health and wellness. Preventive care visits are also called wellness exams. What can I expect for my preventive care visit? Counseling During your preventive care visit, your health care provider may ask about your: Medical history, including: Past medical problems. Family medical history. History of falls. Current health, including: Emotional well-being. Home life and relationship well-being. Sexual activity. Memory and ability to understand (cognition). Lifestyle, including: Alcohol, nicotine or tobacco, and drug use. Access to firearms. Diet, exercise, and sleep habits. Work and work Statistician. Sunscreen use. Safety issues such as seatbelt and bike helmet use. Physical exam Your health care provider will check your: Height and weight. These may be used to calculate your BMI (body mass index). BMI is a measurement that tells if you are at a healthy weight. Waist circumference. This measures the distance around your waistline. This measurement also tells if you are at a healthy weight and may help predict your risk of certain diseases, such as type 2 diabetes and high blood pressure. Heart rate and blood pressure. Body temperature. Skin for abnormal spots. What immunizations do I need?  Vaccines are usually given at various ages, according to a schedule. Your health care provider will recommend vaccines for you based on your age, medical history, and lifestyle or other factors, such as travel or where you work. What tests do I need? Screening Your health care provider may recommend screening tests for certain conditions. This may include: Lipid and cholesterol levels. Diabetes screening. This is  done by checking your blood sugar (glucose) after you have not eaten for a while (fasting). Hepatitis C test. Hepatitis B test. HIV (human immunodeficiency virus) test. STI (sexually transmitted infection) testing, if you are at risk. Lung cancer screening. Colorectal cancer screening. Prostate cancer screening. Abdominal aortic aneurysm (AAA) screening. You may need this if you are a current or former smoker. Talk with your health care provider about your test results, treatment options, and if necessary, the need for more tests. Follow these instructions at home: Eating and drinking  Eat a diet that includes fresh fruits and vegetables, whole grains, lean protein, and low-fat dairy products. Limit your intake of foods with high amounts of sugar, saturated fats, and salt. Take vitamin and mineral supplements as recommended by your health care provider. Do not drink alcohol if your health care provider tells you not to drink. If you drink alcohol: Limit how much you have to 0-2 drinks a day. Know how much alcohol is in your drink. In the [place] one drink equals one 12 oz bottle of beer (355 mL), one 5 oz glass of wine (148 mL), or one 1 oz glass of hard liquor (44 mL). Lifestyle Brush your teeth every morning and night with fluoride toothpaste. Floss one time each day. Exercise for at least 30 minutes 5 or more days each week. Do not use any products that contain nicotine or tobacco. These products include cigarettes, chewing tobacco, and vaping devices, such as e-cigarettes. If you need help quitting, ask your health care provider. Do not use drugs. If you are sexually active, practice safe sex. Use a condom or other form of protection to prevent STIs. Take aspirin only as told by your health care  provider. Make sure that you understand how much to take and what form to take. Work with your health care provider to find out whether it is safe and beneficial for you to take aspirin  daily. Ask your health care provider if you need to take a cholesterol-lowering medicine (statin). Find healthy ways to manage stress, such as: Meditation, yoga, or listening to music. Journaling. Talking to a trusted person. Spending time with friends and family. Safety Always wear your seat belt while driving or riding in a vehicle. Do not drive: If you have been drinking alcohol. Do not ride with someone who has been drinking. When you are tired or distracted. While texting. If you have been using any mind-altering substances or drugs. Wear a helmet and other protective equipment during sports activities. If you have firearms in your house, make sure you follow all gun safety procedures. Minimize exposure to UV radiation to reduce your risk of skin cancer. What's next? Visit your health care provider once a year for an annual wellness visit. Ask your health care provider how often you should have your eyes and teeth checked. Stay up to date on all vaccines. This information is not intended to replace advice given to you by your health care provider. Make sure you discuss any questions you have with your health care provider. Document Revised: 11/15/2020 Document Reviewed: 11/15/2020 Elsevier Patient Education  [place].

## 2022-08-21 LAB — COMPLETE METABOLIC PANEL WITH GFR
AG Ratio: 1.5 (calc) (ref 1.0–2.5)
ALT: 25 U/L (ref 9–46)
AST: 23 U/L (ref 10–35)
Albumin: 4.3 g/dL (ref 3.6–5.1)
Alkaline phosphatase (APISO): 56 U/L (ref 35–144)
BUN: 17 mg/dL (ref 7–25)
CO2: 26 mmol/L (ref 20–32)
Calcium: 9.9 mg/dL (ref 8.6–10.3)
Chloride: 104 mmol/L (ref 98–110)
Creat: 0.96 mg/dL (ref 0.70–1.35)
Globulin: 2.8 g/dL (calc) (ref 1.9–3.7)
Glucose, Bld: 94 mg/dL (ref 65–99)
Potassium: 4.8 mmol/L (ref 3.5–5.3)
Sodium: 141 mmol/L (ref 135–146)
Total Bilirubin: 0.9 mg/dL (ref 0.2–1.2)
Total Protein: 7.1 g/dL (ref 6.1–8.1)
eGFR: 87 mL/min/{1.73_m2} (ref >=60)

## 2022-08-21 LAB — CBC WITH DIFFERENTIAL/PLATELET
Absolute Monocytes: 582 cells/uL (ref 200–950)
Basophils Absolute: 28 cells/uL (ref 0–200)
Basophils Relative: 0.4 %
Eosinophils Absolute: 163 cells/uL (ref 15–500)
Eosinophils Relative: 2.3 %
HCT: 49.3 % (ref 38.5–50.0)
Hemoglobin: 16.6 g/dL (ref 13.2–17.1)
Lymphs Abs: 1562 cells/uL (ref 850–3900)
MCH: 30.9 pg (ref 27.0–33.0)
MCHC: 33.7 g/dL (ref 32.0–36.0)
MCV: 91.6 fL (ref 80.0–100.0)
MPV: 9.4 fL (ref 7.5–12.5)
Monocytes Relative: 8.2 %
Neutro Abs: 4764 cells/uL (ref 1500–7800)
Neutrophils Relative %: 67.1 %
Platelets: 306 10*3/uL (ref 140–400)
RBC: 5.38 10*6/uL (ref 4.20–5.80)
RDW: 12.3 % (ref 11.0–15.0)
Total Lymphocyte: 22 %
WBC: 7.1 10*3/uL (ref 3.8–10.8)

## 2022-08-21 LAB — LIPID PANEL W/REFLEX DIRECT LDL
Cholesterol: 191 mg/dL (ref <200)
HDL: 52 mg/dL (ref >=40)
LDL Cholesterol (Calc): 115 mg/dL (calc) — ABNORMAL HIGH
Non-HDL Cholesterol (Calc): 139 mg/dL (calc) — ABNORMAL HIGH (ref <130)
Total CHOL/HDL Ratio: 3.7 (calc) (ref <5.0)
Triglycerides: 126 mg/dL (ref <150)

## 2022-08-21 LAB — PSA: PSA: 3.26 ng/mL (ref <=4.00)

## 2022-09-02 ENCOUNTER — Other Ambulatory Visit: Payer: Self-pay

## 2022-09-02 DIAGNOSIS — I10 Essential (primary) hypertension: Secondary | ICD-10-CM

## 2022-09-03 ENCOUNTER — Ambulatory Visit (INDEPENDENT_AMBULATORY_CARE_PROVIDER_SITE_OTHER): Payer: BC Managed Care – PPO | Admitting: Family Medicine

## 2022-09-03 ENCOUNTER — Other Ambulatory Visit: Payer: Self-pay

## 2022-09-03 VITALS — BP 126/72 | HR 75 | Ht 69.0 in

## 2022-09-03 DIAGNOSIS — I10 Essential (primary) hypertension: Secondary | ICD-10-CM

## 2022-09-03 NOTE — Progress Notes (Signed)
   Established Patient Office Visit  Subjective   Patient ID: Jake Huber, male    DOB: [birth year]  Age: 67 y.o. MRN: [id number]  Chief Complaint  Patient presents with   Hypertension    BP check - nurse visit-     HPI  Hypertension- BP check nurse visit.  Patient denies chest pain , shortness of breath, dizziness,  palpitations or problems with medication. Patient will need lab drawn for BMP today as well.   ROS    Objective:     BP 126/72   Pulse 75   Ht 5\' 9"  (1.753 m)   SpO2 95%   BMI 33.51 kg/m    Physical Exam   No results found for any visits on 09/03/22.    The 10-year ASCVD risk score (Arnett DK, et al., 2019) is: 15.8%    Assessment & Plan:  Hypertension- BP check nurse visit- BP reading = 126/72. Patient will keep upcoming appt schld for  02/21/2023 with Dr. Zigmund Daniel. Patient had lab work drawn before leaving the office today.  Problem List Items Addressed This Visit       Cardiovascular and Mediastinum   Essential hypertension - Primary    Return for appt schld with Dr. Zigmund Daniel for 02/21/2023.Rae Lips, LPN

## 2022-09-03 NOTE — Progress Notes (Signed)
Patient ID: Jake Huber, male   DOB: [birth year], 67 y.o.   MRN: [id number]  Medical screening examination/treatment was performed by qualified clinical staff member and as supervising physician I was immediately available for consultation/collaboration. I have reviewed documentation and agree with assessment and plan.  Maisie Fus. Mel Almond, DO

## 2022-09-03 NOTE — Patient Instructions (Signed)
Patient will keep upcoming appt schld for 02/21/2023.

## 2022-09-04 LAB — BASIC METABOLIC PANEL
BUN: 23 mg/dL (ref 7–25)
CO2: 24 mmol/L (ref 20–32)
Calcium: 9.6 mg/dL (ref 8.6–10.3)
Chloride: 105 mmol/L (ref 98–110)
Creat: 1.07 mg/dL (ref 0.70–1.35)
Glucose, Bld: 80 mg/dL (ref 65–99)
Potassium: 4.8 mmol/L (ref 3.5–5.3)
Sodium: 139 mmol/L (ref 135–146)

## 2022-12-16 DIAGNOSIS — M25562 Pain in left knee: Secondary | ICD-10-CM | POA: Diagnosis not present

## 2022-12-16 DIAGNOSIS — M7651 Patellar tendinitis, right knee: Secondary | ICD-10-CM | POA: Diagnosis not present

## 2022-12-16 DIAGNOSIS — M25561 Pain in right knee: Secondary | ICD-10-CM | POA: Diagnosis not present

## 2022-12-24 DIAGNOSIS — M25561 Pain in right knee: Secondary | ICD-10-CM | POA: Diagnosis not present

## 2022-12-24 DIAGNOSIS — M25562 Pain in left knee: Secondary | ICD-10-CM | POA: Diagnosis not present

## 2023-01-31 DIAGNOSIS — R29898 Other symptoms and signs involving the musculoskeletal system: Secondary | ICD-10-CM | POA: Diagnosis not present

## 2023-01-31 DIAGNOSIS — Z7409 Other reduced mobility: Secondary | ICD-10-CM | POA: Diagnosis not present

## 2023-01-31 DIAGNOSIS — M25561 Pain in right knee: Secondary | ICD-10-CM | POA: Diagnosis not present

## 2023-01-31 DIAGNOSIS — M25562 Pain in left knee: Secondary | ICD-10-CM | POA: Diagnosis not present

## 2023-02-21 ENCOUNTER — Encounter: Payer: Self-pay | Admitting: Family Medicine

## 2023-02-21 ENCOUNTER — Ambulatory Visit (INDEPENDENT_AMBULATORY_CARE_PROVIDER_SITE_OTHER): Payer: BC Managed Care – PPO | Admitting: Family Medicine

## 2023-02-21 VITALS — BP 121/78 | HR 70 | Ht 69.0 in | Wt 233.0 lb

## 2023-02-21 DIAGNOSIS — M170 Bilateral primary osteoarthritis of knee: Secondary | ICD-10-CM | POA: Diagnosis not present

## 2023-02-21 DIAGNOSIS — L570 Actinic keratosis: Secondary | ICD-10-CM

## 2023-02-21 DIAGNOSIS — Z23 Encounter for immunization: Secondary | ICD-10-CM | POA: Diagnosis not present

## 2023-02-21 DIAGNOSIS — I10 Essential (primary) hypertension: Secondary | ICD-10-CM

## 2023-02-21 NOTE — Assessment & Plan Note (Signed)
BP is well controlled with valsartan 80mg  daily. Recommend continuation.

## 2023-02-21 NOTE — Progress Notes (Signed)
Jake Huber - 67 y.o. male MRN [id number]  Date of birth: 1955-11-08  Subjective Chief Complaint  Patient presents with   Immunizations   Hypertension    HPI Jake Huber is a 67 y.o. male here today for follow up visit.   Jake Huber reports that Jake Huber is doing well.  Continues on valsartan for management HTN.  BP is well controlled.  Denies side effects from medication.  Jake Huber has not had chest pain, shortness of breath, palpitations, headache or vision changes.    Seeing orthopedics for chronic knee pain.  Jake Huber has done PT and tried voltaren tablets.  This seems to help quite a bit for him.   Has skin lesion on face that Jake Huber would like evaluated.   ROS:  A comprehensive ROS was completed and negative except as noted per HPI  No Known Allergies  Past Medical History:  Diagnosis Date   Kidney stones     Past Surgical History:  Procedure Laterality Date   APPENDECTOMY     BACK SURGERY      Social History   Socioeconomic History   Marital status: Divorced    Spouse name: Not on file   Number of children: Not on file   Years of education: Not on file   Highest education level: Not on file  Occupational History   Not on file  Tobacco Use   Smoking status: Former    Current packs/day: 0.00    Average packs/day: 1.5 packs/day for 25.0 years (37.5 ttl pk-yrs)    Types: Cigarettes    Start date: 06/03/1970    Quit date: 06/04/1995    Years since quitting: 27.7   Smokeless tobacco: Never  Vaping Use   Vaping status: Never Used  Substance and Sexual Activity   Alcohol use: Not Currently   Drug use: Not Currently   Sexual activity: Not Currently    Partners: Female  Other Topics Concern   Not on file  Social History Narrative   Not on file   Social Determinants of Health   Financial Resource Strain: Not on file  Food Insecurity: Low Risk  (12/16/2022)   Received from [facility]   Hunger Vital Sign    Worried About Running Out of Food in the Last Year: Never true     Ran Out of Food in the Last Year: Never true  Transportation Needs: Not on file (12/16/2022)  Physical Activity: Not on file  Stress: Not on file  Social Connections: Unknown (10/13/2021)   Received from [facility]   Social Network    Social Network: Not on file    Family History  Problem Relation Age of Onset   Healthy Mother    Cancer Father    Testicular cancer Brother     Health Maintenance  Topic Date Due   COVID-19 Vaccine (6 - 2023-24 season) 03/09/2023 (Originally 02/02/2023)   Colonoscopy  08/20/2023 (Originally 01/30/2000)   INFLUENZA VACCINE  09/01/2023 (Originally 01/02/2023)   Hepatitis C Screening  02/21/2024 (Originally 01/29/1973)   DTaP/Tdap/Td (3 - Td or Tdap) 08/30/2027   Pneumonia Vaccine 59+ Years old  Completed   Zoster Vaccines- Shingrix  Completed   HPV VACCINES  Aged Out     ----------------------------------------------------------------------------------------------------------------------------------------------------------------------------------------------------------------- Physical Exam BP 121/78 (BP Location: Left Arm, Patient Position: Sitting, Cuff Size: Large)   Pulse 70   Ht 5\' 9"  (1.753 m)   Wt 233 lb (105.7 kg)   SpO2 97%   BMI 34.41 kg/m   Physical  Exam Constitutional:      Appearance: Normal appearance.  Eyes:     General: No scleral icterus. Cardiovascular:     Rate and Rhythm: Normal rate and regular rhythm.  Pulmonary:     Effort: Pulmonary effort is normal.     Breath sounds: Normal breath sounds.  Skin:    Comments: Rough, hyperkeratotic lesion to left cheek.   Neurological:     Mental Status: Jake Huber is alert.  Psychiatric:        Mood and Affect: Mood normal.        Behavior: Behavior normal.    Procedure:  Verbal consent given.  Treatment of L cheek lesion with liquid nitrogen.  2 freeze thaw cycles utilized with 2mm frost ring around lesion with each treatment.  Jake Huber tolerated this well.   ------------------------------------------------------------------------------------------------------------------------------------------------------------------------------------------------------------------- Assessment and Plan  Actinic keratosis L cheek lesion treated with liquid nitrogen today.  Jake Huber tolerated this well.   Essential hypertension BP is well controlled with valsartan 80mg  daily. Recommend continuation.   Bilateral primary osteoarthritis of knee Seeing orthopedics.  Voltaren working well as needed.    No orders of the defined types were placed in this encounter.   Return in about 6 months (around 08/21/2023) for Hypertension/fasting labs.    This visit occurred during the SARS-CoV-2 public health emergency.  Safety protocols were in place, including screening questions prior to the visit, additional usage of staff PPE, and extensive cleaning of exam room while observing appropriate contact time as indicated for disinfecting solutions.

## 2023-02-21 NOTE — Assessment & Plan Note (Signed)
L cheek lesion treated with liquid nitrogen today.  He tolerated this well.

## 2023-02-21 NOTE — Assessment & Plan Note (Signed)
Seeing orthopedics.  Voltaren working well as needed.

## 2023-03-07 DIAGNOSIS — M25561 Pain in right knee: Secondary | ICD-10-CM | POA: Diagnosis not present

## 2023-03-07 DIAGNOSIS — M25562 Pain in left knee: Secondary | ICD-10-CM | POA: Diagnosis not present

## 2023-03-10 DIAGNOSIS — Z1211 Encounter for screening for malignant neoplasm of colon: Secondary | ICD-10-CM | POA: Diagnosis not present

## 2023-03-10 DIAGNOSIS — K5730 Diverticulosis of large intestine without perforation or abscess without bleeding: Secondary | ICD-10-CM | POA: Diagnosis not present

## 2023-03-10 LAB — HM COLONOSCOPY

## 2023-08-04 ENCOUNTER — Other Ambulatory Visit: Payer: Self-pay | Admitting: Family Medicine

## 2023-08-22 ENCOUNTER — Ambulatory Visit (INDEPENDENT_AMBULATORY_CARE_PROVIDER_SITE_OTHER): Payer: BC Managed Care – PPO | Admitting: Family Medicine

## 2023-08-22 ENCOUNTER — Encounter: Payer: Self-pay | Admitting: Family Medicine

## 2023-08-22 VITALS — BP 144/80 | HR 62 | Ht 69.0 in | Wt 239.0 lb

## 2023-08-22 DIAGNOSIS — Z125 Encounter for screening for malignant neoplasm of prostate: Secondary | ICD-10-CM | POA: Diagnosis not present

## 2023-08-22 DIAGNOSIS — I10 Essential (primary) hypertension: Secondary | ICD-10-CM | POA: Diagnosis not present

## 2023-08-22 DIAGNOSIS — E782 Mixed hyperlipidemia: Secondary | ICD-10-CM | POA: Diagnosis not present

## 2023-08-22 NOTE — Assessment & Plan Note (Signed)
 Mild elevation in BP today.  Better on repeat BP check.  He has had good readings at home.  He will continue valsartan with plan to follow up in 6 months.

## 2023-08-22 NOTE — Progress Notes (Signed)
 Jake Huber - 68 y.o. male MRN [id number]  Date of birth: 12/21/1955  Subjective Chief Complaint  Patient presents with   Medical Management of Chronic Issues    HPI Jake Huber is a 68 y.o. male here today for follow up visit.   He reports that he is doing well.  He continues on valsartan 80mg  daily for management of BP.  His BP is elevated today.  He denies symptoms at this time including chest pain, shortness of breath, palpitations, headache or vision changes. Weight is increased some from last visit.    He has wanted to avoid statin and is using red yeast rice instead.  Due for updated lipid check.    ROS:  A comprehensive ROS was completed and negative except as noted per HPI  No Known Allergies  Past Medical History:  Diagnosis Date   Kidney stones     Past Surgical History:  Procedure Laterality Date   APPENDECTOMY     BACK SURGERY      Social History   Socioeconomic History   Marital status: Divorced    Spouse name: Not on file   Number of children: Not on file   Years of education: Not on file   Highest education level: 12th grade  Occupational History   Not on file  Tobacco Use   Smoking status: Former    Current packs/day: 0.00    Average packs/day: 1.5 packs/day for 25.0 years (37.5 ttl pk-yrs)    Types: Cigarettes    Start date: 06/03/1970    Quit date: 06/04/1995    Years since quitting: 28.2   Smokeless tobacco: Never  Vaping Use   Vaping status: Never Used  Substance and Sexual Activity   Alcohol use: Not Currently   Drug use: Not Currently   Sexual activity: Not Currently    Partners: Female  Other Topics Concern   Not on file  Social History Narrative   Not on file   Social Drivers of Health   Financial Resource Strain: Low Risk  (08/21/2023)   Overall Financial Resource Strain (CARDIA)    Difficulty of Paying Living Expenses: Not hard at all  Food Insecurity: No Food Insecurity (08/21/2023)   Hunger Vital Sign    Worried  About Running Out of Food in the Last Year: Never true    Ran Out of Food in the Last Year: Never true  Transportation Needs: No Transportation Needs (08/21/2023)   PRAPARE - Administrator, Civil Service (Medical): No    Lack of Transportation (Non-Medical): No  Physical Activity: Sufficiently Active (08/21/2023)   Exercise Vital Sign    Days of Exercise per Week: 5 days    Minutes of Exercise per Session: 30 min  Stress: No Stress Concern Present (08/21/2023)   [employer] of Occupational Health - Occupational Stress Questionnaire    Feeling of Stress : Not at all  Social Connections: Socially Isolated (08/21/2023)   Social Connection and Isolation Panel [NHANES]    Frequency of Communication with Friends and Family: Never    Frequency of Social Gatherings with Friends and Family: Never    Attends Religious Services: Never    Database administrator or Organizations: No    Attends Engineer, structural: Not on file    Marital Status: Divorced    Family History  Problem Relation Age of Onset   Healthy Mother    Cancer Father    Testicular cancer Brother     Health  Maintenance  Topic Date Due   Hepatitis C Screening  02/21/2024 (Originally 01/29/1973)   COVID-19 Vaccine (7 - Pfizer risk 2024-25 season) 10/06/2023   DTaP/Tdap/Td (3 - Td or Tdap) 08/30/2027   Colonoscopy  03/09/2033   Pneumonia Vaccine 59+ Years old  Completed   INFLUENZA VACCINE  Completed   Zoster Vaccines- Shingrix  Completed   HPV VACCINES  Aged Out     ----------------------------------------------------------------------------------------------------------------------------------------------------------------------------------------------------------------- Physical Exam BP (!) 144/80   Pulse 62   Ht 5\' 9"  (1.753 m)   Wt 239 lb (108.4 kg)   SpO2 97%   BMI 35.29 kg/m   Physical Exam Constitutional:      Appearance: Normal appearance.  Eyes:     General: No scleral  icterus. Cardiovascular:     Rate and Rhythm: Normal rate and regular rhythm.  Pulmonary:     Effort: Pulmonary effort is normal.     Breath sounds: Normal breath sounds.  Neurological:     Mental Status: He is alert.     ------------------------------------------------------------------------------------------------------------------------------------------------------------------------------------------------------------------- Assessment and Plan  Essential hypertension Mild elevation in BP today.  Better on repeat BP check.  He has had good readings at home.  He will continue valsartan with plan to follow up in 6 months.    Mixed hyperlipidemia Updated lipid panel order.    No orders of the defined types were placed in this encounter.   No follow-ups on file.    This visit occurred during the SARS-CoV-2 public health emergency.  Safety protocols were in place, including screening questions prior to the visit, additional usage of staff PPE, and extensive cleaning of exam room while observing appropriate contact time as indicated for disinfecting solutions.

## 2023-08-22 NOTE — Assessment & Plan Note (Signed)
Updated lipid panel order.

## 2023-08-23 LAB — CMP14+EGFR
ALT: 26 IU/L (ref 0–44)
AST: 22 IU/L (ref 0–40)
Albumin: 4.3 g/dL (ref 3.9–4.9)
Alkaline Phosphatase: 63 IU/L (ref 44–121)
BUN/Creatinine Ratio: 20 (ref 10–24)
BUN: 28 mg/dL — ABNORMAL HIGH (ref 8–27)
Bilirubin Total: 0.5 mg/dL (ref 0.0–1.2)
CO2: 22 mmol/L (ref 20–29)
Calcium: 9.3 mg/dL (ref 8.6–10.2)
Chloride: 104 mmol/L (ref 96–106)
Creatinine, Ser: 1.42 mg/dL — ABNORMAL HIGH (ref 0.76–1.27)
Globulin, Total: 2.4 g/dL (ref 1.5–4.5)
Glucose: 108 mg/dL — ABNORMAL HIGH (ref 70–99)
Potassium: 5.3 mmol/L — ABNORMAL HIGH (ref 3.5–5.2)
Sodium: 141 mmol/L (ref 134–144)
Total Protein: 6.7 g/dL (ref 6.0–8.5)
eGFR: 54 mL/min/{1.73_m2} — ABNORMAL LOW (ref >59)

## 2023-08-23 LAB — CBC WITH DIFFERENTIAL/PLATELET
Basophils Absolute: 0 10*3/uL (ref 0.0–0.2)
Basos: 1 %
EOS (ABSOLUTE): 0.1 10*3/uL (ref 0.0–0.4)
Eos: 2 %
Hematocrit: 44.5 % (ref 37.5–51.0)
Hemoglobin: 14.9 g/dL (ref 13.0–17.7)
Immature Grans (Abs): 0 10*3/uL (ref 0.0–0.1)
Immature Granulocytes: 0 %
Lymphocytes Absolute: 1 10*3/uL (ref 0.7–3.1)
Lymphs: 21 %
MCH: 31.4 pg (ref 26.6–33.0)
MCHC: 33.5 g/dL (ref 31.5–35.7)
MCV: 94 fL (ref 79–97)
Monocytes Absolute: 0.5 10*3/uL (ref 0.1–0.9)
Monocytes: 9 %
Neutrophils Absolute: 3.2 10*3/uL (ref 1.4–7.0)
Neutrophils: 67 %
Platelets: 277 10*3/uL (ref 150–450)
RBC: 4.74 x10E6/uL (ref 4.14–5.80)
RDW: 12.1 % (ref 11.6–15.4)
WBC: 4.9 10*3/uL (ref 3.4–10.8)

## 2023-08-23 LAB — LIPID PANEL
Chol/HDL Ratio: 4.5 ratio (ref 0.0–5.0)
Cholesterol, Total: 179 mg/dL (ref 100–199)
HDL: 40 mg/dL (ref >39)
LDL Chol Calc (NIH): 119 mg/dL — ABNORMAL HIGH (ref 0–99)
Triglycerides: 108 mg/dL (ref 0–149)
VLDL Cholesterol Cal: 20 mg/dL (ref 5–40)

## 2023-08-23 LAB — PSA: Prostate Specific Ag, Serum: 4.3 ng/mL — ABNORMAL HIGH (ref 0.0–4.0)

## 2023-08-29 ENCOUNTER — Other Ambulatory Visit: Payer: Self-pay | Admitting: Family Medicine

## 2023-08-29 ENCOUNTER — Encounter: Payer: Self-pay | Admitting: Family Medicine

## 2023-08-29 DIAGNOSIS — R972 Elevated prostate specific antigen [PSA]: Secondary | ICD-10-CM

## 2023-08-29 DIAGNOSIS — R7989 Other specified abnormal findings of blood chemistry: Secondary | ICD-10-CM

## 2024-01-29 ENCOUNTER — Other Ambulatory Visit: Payer: Self-pay | Admitting: Family Medicine

## 2024-01-29 DIAGNOSIS — I10 Essential (primary) hypertension: Secondary | ICD-10-CM

## 2024-02-27 ENCOUNTER — Ambulatory Visit (INDEPENDENT_AMBULATORY_CARE_PROVIDER_SITE_OTHER): Admitting: Family Medicine

## 2024-02-27 ENCOUNTER — Encounter: Payer: Self-pay | Admitting: Family Medicine

## 2024-02-27 VITALS — BP 158/82 | HR 67 | Ht 69.0 in | Wt 236.0 lb

## 2024-02-27 DIAGNOSIS — I10 Essential (primary) hypertension: Secondary | ICD-10-CM | POA: Diagnosis not present

## 2024-02-27 DIAGNOSIS — Z23 Encounter for immunization: Secondary | ICD-10-CM

## 2024-02-27 DIAGNOSIS — J069 Acute upper respiratory infection, unspecified: Secondary | ICD-10-CM | POA: Diagnosis not present

## 2024-02-27 DIAGNOSIS — E782 Mixed hyperlipidemia: Secondary | ICD-10-CM

## 2024-02-27 MED ORDER — VALSARTAN 80 MG PO TABS: 80.0000 mg | ORAL_TABLET | Freq: Every day | ORAL | 1 refills | Status: AC

## 2024-02-27 NOTE — Assessment & Plan Note (Signed)
 BP is elevated today.  He is using decongestant for cold which is likely driving BP up.  Continue valsartan  at current strength and return in 2 weeks for BP recheck.

## 2024-02-27 NOTE — Patient Instructions (Signed)
 You can try Delsym for cough.

## 2024-02-27 NOTE — Assessment & Plan Note (Signed)
 Lab Results  Component Value Date   LDLCALC 119 (H) 08/22/2023  Declines statin

## 2024-02-27 NOTE — Progress Notes (Signed)
 Tore Carreker - 68 y.o. male MRN [id number]  Date of birth: Feb 25, 1956  Subjective Chief Complaint  Patient presents with   Hypertension    HPI Degan Hanser is a 68 y.o. male here today for follow up visit.   He reports that he is doing ok.  He has had a cold for a few days. Started with sore throat and congestion.  Now mostly just nasal congestion.  Denies fever, chills or body aches.  Using mucinex fast max.    BP is managed with valsartan .  He is doing well with this at current strength.  Denies side effects.  BP today is elevated on initial check.  He is taking a decongestant for a cold.  He report that his BP looked ok up until he had the cold.  He has not had chest pain, shortness of breath, palpitations, headache or vision changes.    ROS:  A comprehensive ROS was completed and negative except as noted per HPI  No Known Allergies  Past Medical History:  Diagnosis Date   Kidney stones     Past Surgical History:  Procedure Laterality Date   APPENDECTOMY     BACK SURGERY      Social History   Socioeconomic History   Marital status: Divorced    Spouse name: Not on file   Number of children: Not on file   Years of education: Not on file   Highest education level: Associate degree: occupational, Scientist, product/process development, or vocational program  Occupational History   Not on file  Tobacco Use   Smoking status: Former    Current packs/day: 0.00    Average packs/day: 1.5 packs/day for 25.0 years (37.5 ttl pk-yrs)    Types: Cigarettes    Start date: 06/03/1970    Quit date: 06/04/1995    Years since quitting: 28.7   Smokeless tobacco: Never  Vaping Use   Vaping status: Never Used  Substance and Sexual Activity   Alcohol use: Not Currently   Drug use: Not Currently   Sexual activity: Not Currently    Partners: Female  Other Topics Concern   Not on file  Social History Narrative   Not on file   Social Drivers of Health   Financial Resource Strain: Low Risk  (02/25/2024)    Overall Financial Resource Strain (CARDIA)    Difficulty of Paying Living Expenses: Not hard at all  Food Insecurity: No Food Insecurity (02/25/2024)   Hunger Vital Sign    Worried About Running Out of Food in the Last Year: Never true    Ran Out of Food in the Last Year: Never true  Transportation Needs: No Transportation Needs (02/25/2024)   PRAPARE - Administrator, Civil Service (Medical): No    Lack of Transportation (Non-Medical): No  Physical Activity: Sufficiently Active (02/25/2024)   Exercise Vital Sign    Days of Exercise per Week: 5 days    Minutes of Exercise per Session: 30 min  Stress: No Stress Concern Present (02/25/2024)   [employer] of Occupational Health - Occupational Stress Questionnaire    Feeling of Stress: Not at all  Social Connections: Socially Isolated (02/25/2024)   Social Connection and Isolation Panel    Frequency of Communication with Friends and Family: Never    Frequency of Social Gatherings with Friends and Family: Never    Attends Religious Services: Never    Database administrator or Organizations: No    Attends Banker Meetings: Not on file  Marital Status: Divorced    Family History  Problem Relation Age of Onset   Healthy Mother    Cancer Father    Testicular cancer Brother     Health Maintenance  Topic Date Due   Hepatitis C Screening  Never done   Influenza Vaccine  01/02/2024   COVID-19 Vaccine (8 - 2025-26 season) 03/14/2024 (Originally 02/02/2024)   DTaP/Tdap/Td (3 - Td or Tdap) 08/30/2027   Colonoscopy  03/09/2033   Pneumococcal Vaccine: 50+ Years  Completed   Zoster Vaccines- Shingrix   Completed   HPV VACCINES  Aged Out   Meningococcal B Vaccine  Aged Out     ----------------------------------------------------------------------------------------------------------------------------------------------------------------------------------------------------------------- Physical Exam BP (!) 163/83  (BP Location: Left Arm, Patient Position: Sitting, Cuff Size: Normal)   Pulse 67   Ht 5' 9 (1.753 m)   Wt 236 lb (107 kg)   SpO2 96%   BMI 34.85 kg/m   Physical Exam Constitutional:      Appearance: Normal appearance.  HENT:     Head: Normocephalic and atraumatic.  Cardiovascular:     Rate and Rhythm: Normal rate and regular rhythm.  Pulmonary:     Effort: Pulmonary effort is normal.     Breath sounds: Normal breath sounds.  Musculoskeletal:     Cervical back: Neck supple.  Neurological:     General: No focal deficit present.     Mental Status: He is alert.  Psychiatric:        Mood and Affect: Mood normal.        Behavior: Behavior normal.     ------------------------------------------------------------------------------------------------------------------------------------------------------------------------------------------------------------------- Assessment and Plan  Essential hypertension BP is elevated today.  He is using decongestant for cold which is likely driving BP up.  Continue valsartan  at current strength and return in 2 weeks for BP recheck.   Mixed hyperlipidemia Lab Results  Component Value Date   LDLCALC 119 (H) 08/22/2023  Declines statin   Viral URI Continue supportive care with good hydration.  Recommend avoiding decongestants.  Try delsym prn for cough.     No orders of the defined types were placed in this encounter.   Return in about 2 weeks (around 03/12/2024) for nurse visit for BP check.

## 2024-02-27 NOTE — Assessment & Plan Note (Signed)
 Continue supportive care with good hydration.  Recommend avoiding decongestants.  Try delsym prn for cough.

## 2024-03-12 ENCOUNTER — Ambulatory Visit (INDEPENDENT_AMBULATORY_CARE_PROVIDER_SITE_OTHER)

## 2024-03-12 VITALS — BP 159/88 | HR 71 | Resp 18 | Ht 69.0 in

## 2024-03-12 DIAGNOSIS — I10 Essential (primary) hypertension: Secondary | ICD-10-CM

## 2024-03-12 NOTE — Progress Notes (Signed)
 Patient is in office today for a 2 week blood pressure check. Denies headaches, palpitations, medication changes or issues. Patient is taking valsartan  80 mg/day in the mornings. Pt did show some of his at home readings and it runs a little under 120/80.   Pt first BP reading was 159/88. Pt sat for 10 mins and then it was rechecked the second reading was   Dr. Alvia was notified of the readings and would like the patient to stay on the same regimen and f/u PRN.

## 2024-06-14 ENCOUNTER — Emergency Department (HOSPITAL_COMMUNITY)
Admission: EM | Admit: 2024-06-14 | Discharge: 2024-06-15 | Disposition: A | Discharge status: Home / Self Care | Attending: Emergency Medicine | Admitting: Emergency Medicine

## 2024-06-14 ENCOUNTER — Emergency Department (HOSPITAL_COMMUNITY)

## 2024-06-14 DIAGNOSIS — R202 Paresthesia of skin: Secondary | ICD-10-CM | POA: Diagnosis not present

## 2024-06-14 DIAGNOSIS — M5416 Radiculopathy, lumbar region: Secondary | ICD-10-CM | POA: Diagnosis not present

## 2024-06-14 DIAGNOSIS — M5442 Lumbago with sciatica, left side: Secondary | ICD-10-CM | POA: Insufficient documentation

## 2024-06-14 DIAGNOSIS — R109 Unspecified abdominal pain: Secondary | ICD-10-CM | POA: Insufficient documentation

## 2024-06-14 DIAGNOSIS — M545 Low back pain, unspecified: Secondary | ICD-10-CM | POA: Diagnosis present

## 2024-06-14 LAB — BASIC METABOLIC PANEL WITH GFR
Anion gap: 15 (ref 5–15)
BUN: 22 mg/dL (ref 8–23)
CO2: 17 mmol/L — ABNORMAL LOW (ref 22–32)
Calcium: 9 mg/dL (ref 8.9–10.3)
Chloride: 102 mmol/L (ref 98–111)
Creatinine, Ser: 1 mg/dL (ref 0.61–1.24)
GFR, Estimated: >60 mL/min
Glucose, Bld: 112 mg/dL — ABNORMAL HIGH (ref 70–99)
Potassium: 4.2 mmol/L (ref 3.5–5.1)
Sodium: 133 mmol/L — ABNORMAL LOW (ref 135–145)

## 2024-06-14 LAB — CBC WITH DIFFERENTIAL/PLATELET
Abs Immature Granulocytes: 0.04 K/uL (ref 0.00–0.07)
Basophils Absolute: 0 K/uL (ref 0.0–0.1)
Basophils Relative: 0 %
Eosinophils Absolute: 0 K/uL (ref 0.0–0.5)
Eosinophils Relative: 0 %
HCT: 46.4 % (ref 39.0–52.0)
Hemoglobin: 15.7 g/dL (ref 13.0–17.0)
Immature Granulocytes: 0 %
Lymphocytes Relative: 10 %
Lymphs Abs: 0.9 K/uL (ref 0.7–4.0)
MCH: 31.7 pg (ref 26.0–34.0)
MCHC: 33.8 g/dL (ref 30.0–36.0)
MCV: 93.7 fL (ref 80.0–100.0)
Monocytes Absolute: 0.5 K/uL (ref 0.1–1.0)
Monocytes Relative: 5 %
Neutro Abs: 7.8 K/uL — ABNORMAL HIGH (ref 1.7–7.7)
Neutrophils Relative %: 85 %
Platelets: 270 K/uL (ref 150–400)
RBC: 4.95 MIL/uL (ref 4.22–5.81)
RDW: 12.4 % (ref 11.5–15.5)
WBC: 9.2 K/uL (ref 4.0–10.5)
nRBC: 0 % (ref 0.0–0.2)

## 2024-06-14 MED ORDER — OXYCODONE-ACETAMINOPHEN 5-325 MG PO TABS
1.0000 | ORAL_TABLET | Freq: Once | ORAL | Status: AC
  Administered 2024-06-14: 1 via ORAL
  Filled 2024-06-14: qty 1

## 2024-06-14 NOTE — ED Provider Triage Note (Signed)
 Emergency Medicine Provider Triage Evaluation Note  Jake Huber , a 69 y.o. male  was evaluated in triage.  Pt complains of left flank pain.  Pain is severe at times.  He feels a knot like sensation in the lower back on the left.  He is okay getting up to walk around but shortly afterward will feel severe pain and has to go sit down before he falls.  He feels like his left leg has not felt like my leg since yesterday.  No bowel or bladder symptoms.  No groin numbness.  He has been taking diclofenac with no relief.  Reports history of laminectomy but that was 30+ years ago.   Review of Systems  Positive: Left flank pain and leg numbness  Negative: Leg weakness or other red flag spine/neuro symptoms.  Physical Exam  BP (!) 148/102   Pulse 100   Temp 98.9 F (37.2 C) (Oral)   Resp 16   SpO2 97%  Gen:   Awake, no distress   Resp:  Normal effort  MSK:   Moves extremities without difficulty.  Normal range of motion of the left hip.  Mild tenderness to palpation along the left paraspinal muscles in the lumbar spine.  No midline spine tenderness.  Other:  2+ DP pulses bilaterally and normal sensation throughout the lower extremities.  Medical Decision Making  Medically screening exam initiated at 5:00 PM.  Appropriate orders placed.  Foch Rosenwald was informed that the remainder of the evaluation will be completed by another provider, this initial triage assessment does not replace that evaluation, and the importance of remaining in the ED until their evaluation is complete.    Darra Fonda MATSU, MD 06/14/24 [phone]

## 2024-06-14 NOTE — ED Triage Notes (Signed)
 PT BIB [facility] from home in [place] for 5/10 L. Hip pain with numbness radiating to L. Leg.    190/100 HR 103, 95% RA, T 99.2, A&O x4

## 2024-06-15 ENCOUNTER — Emergency Department (HOSPITAL_COMMUNITY)

## 2024-06-15 ENCOUNTER — Emergency Department (EMERGENCY_DEPARTMENT_HOSPITAL)

## 2024-06-15 DIAGNOSIS — R202 Paresthesia of skin: Secondary | ICD-10-CM | POA: Diagnosis not present

## 2024-06-15 LAB — URINALYSIS, ROUTINE W REFLEX MICROSCOPIC
Bilirubin Urine: NEGATIVE
Glucose, UA: NEGATIVE mg/dL
Hgb urine dipstick: NEGATIVE
Ketones, ur: 5 mg/dL — AB
Leukocytes,Ua: NEGATIVE
Nitrite: NEGATIVE
Protein, ur: NEGATIVE mg/dL
Specific Gravity, Urine: 1.009 (ref 1.005–1.030)
pH: 5 (ref 5.0–8.0)

## 2024-06-15 LAB — VAS US ABI WITH/WO TBI
Left ABI: 1.22
Right ABI: 1.32

## 2024-06-15 MED ORDER — LIDOCAINE 5 % EX PTCH
2.0000 | MEDICATED_PATCH | CUTANEOUS | Status: DC
  Administered 2024-06-15: 2 via TRANSDERMAL
  Filled 2024-06-15: qty 2

## 2024-06-15 MED ORDER — METHOCARBAMOL 500 MG PO TABS
500.0000 mg | ORAL_TABLET | Freq: Once | ORAL | Status: AC
  Administered 2024-06-15: 500 mg via ORAL
  Filled 2024-06-15: qty 1

## 2024-06-15 MED ORDER — OXYCODONE-ACETAMINOPHEN 5-325 MG PO TABS
1.0000 | ORAL_TABLET | Freq: Once | ORAL | Status: AC
  Administered 2024-06-15: 1 via ORAL
  Filled 2024-06-15: qty 1

## 2024-06-15 MED ORDER — LIDOCAINE 5 % EX PTCH: 1.0000 | MEDICATED_PATCH | CUTANEOUS | 0 refills | Status: AC

## 2024-06-15 MED ORDER — OXYCODONE HCL 5 MG PO TABS: 5.0000 mg | ORAL_TABLET | Freq: Four times a day (QID) | ORAL | 0 refills | Status: AC | PRN

## 2024-06-15 MED ORDER — METHOCARBAMOL 500 MG PO TABS: 500.0000 mg | ORAL_TABLET | Freq: Two times a day (BID) | ORAL | 0 refills | Status: AC

## 2024-06-15 MED ORDER — ACETAMINOPHEN 500 MG PO TABS
1000.0000 mg | ORAL_TABLET | Freq: Once | ORAL | Status: AC
  Administered 2024-06-15: 1000 mg via ORAL
  Filled 2024-06-15: qty 2

## 2024-06-15 NOTE — Discharge Instructions (Addendum)
 Thank you for coming to [facility] Emergency Department. You were seen for lower back pain radiating down left leg. We did an exam, labs, and imaging, and the MRI of your lumbar spine showed: 1. Status post left laminectomy and medial facetectomy at L3-4 with Modic type 2 endplate changes. 2. Moderate bilateral lateral recess stenosis at L3-4, more pronounced on the right, with questionable impingement of the right L4 nerve in the lateral recess. 3. Mild-to-moderate left lateral recess and neural foraminal stenosis at L4-5, without apparent nerve root impingement. 4. No significant spinal canal or neural foraminal stenosis at L5-S1. Conus terminates at T12-L1 without abnormality identified.  You have some pinched nerves that are likely causing your back pain.  Please take Tylenol  1000 mg every 8 hours.  You can add a muscle relaxer Robaxin  500 mg every 12 hours.  You can also use lidocaine  patch in the area once per day.  For severe pain you can add oxycodone  5 mg every 4-6 hours.  If you find you are using this regularly please take 1-2 capfuls of MiraLAX  per day to help event constipation.  Please not drive while taking this medication.  Please be aware this medication can increase risk for sedation and falls.  Please follow-up with the neurosurgeon or spine doctor within 1 to 2 weeks.  You can call [facility] at [phone] today to make a follow-up appointment.  Alternatively, you can follow-up with Dr. Alvia who can refer you to a different spine doctor.  Do not hesitate to return to the ED or call 911 if you experience: -Worsening symptoms -Inability to urinate or have a bowel movement, or urinary or bowel incontinence -Numbness or tingling in your groin -Pain so severe you cannot manage at home -Lightheadedness, passing out -Fevers/chills -Anything else that concerns you

## 2024-06-15 NOTE — ED Notes (Signed)
Pt. Transported to vascular.   

## 2024-06-15 NOTE — ED Notes (Signed)
 Patient transported to MRI

## 2024-06-15 NOTE — ED Provider Notes (Signed)
 " [place] [facility] AT [facility] Provider Note   CSN: [id number] Arrival date & time: 06/14/24  1642     History  No chief complaint on file.   Jake Huber is a 69 y.o. male with PMH as listed below who presents with left flank pain and pain in his left lower back/buttock that radiates down his leg.  Pain is severe at times.  He feels a knot like sensation in the lower back on the left.  He is okay getting up to walk around but shortly afterward will feel severe pain and has to go sit down before he falls.  He feels like his left leg has not felt like my leg since yesterday.  No bowel or bladder symptoms.  No groin numbness.  He has been taking diclofenac with no relief.  Reports history of laminectomy but that was 30+ years ago.   No nausea/vomiting except with pain. Pain is 10/10 when trying to move around. 5/10 lying here and after oxycodone  received from triage. Hasn't eaten much x 2 days.    Past Medical History:  Diagnosis Date   Kidney stones        Home Medications Prior to Admission medications  Medication Sig Start Date End Date Taking? Authorizing Provider  ASHWAGANDHA 35 PO Take by mouth.    [provider]  Coenzyme Q10 (COQ10 PO) Take by mouth.    [provider]  diclofenac (VOLTAREN) 75 MG EC tablet Take 75 mg by mouth 2 (two) times daily.    [provider]  Omega-3 Fatty Acids (FISH OIL) 1000 MG CAPS Take by mouth.    [provider]  Red Yeast Rice Extract (RED YEAST RICE PO) Take by mouth.    [provider]  valsartan  (DIOVAN ) 80 MG tablet Take 1 tablet (80 mg total) by mouth daily. 02/27/24   Alvia Bring, DO      Allergies    Patient has no known allergies.    Review of Systems   Review of Systems A 10 point review of systems was performed and is negative unless otherwise reported in HPI.  Physical Exam Updated Vital Signs BP (!) 151/75   Pulse 86   Temp 98 F (36.7 C)  (Oral)   Resp 17   SpO2 95%  Physical Exam General: Normal appearing male, lying in bed.  HEENT: NCAT, EOMI, PERRLA, Sclera anicteric, MMM, trachea midline.  Cardiology: RRR, no murmurs/rubs/gallops. BL radial and DP pulses equal bilaterally.  Resp: Normal respiratory rate and effort. CTAB, no wheezes, rhonchi, crackles.  Abd: Soft, non-tender, non-distended. No rebound tenderness or guarding.  GU: Deferred. MSK: Compartment soft.  No overlying skin changes or deformities.  No peripheral edema or signs of trauma. Extremities without deformity or TTP. Skin: warm, dry. No rashes or lesions. Back: No CVA tenderness. + Left lateral lumbar and left buttock tenderness to palpation. Neuro: A&Ox4, CNs II-XII grossly intact.  5 out of 5 strength in bilateral lower extremities except for dorsiflexion/plantarflexion in the left ankle is decreased with visible left calf atrophy. Sensation grossly intact.  Normal speech.  Normal speech. Psych: Normal mood and affect.   ED Results / Procedures / Treatments   Labs (all labs ordered are listed, but only abnormal results are displayed) Labs Reviewed  BASIC METABOLIC PANEL WITH GFR - Abnormal; Notable for the following components:      Result Value   Sodium 133 (*)    CO2 17 (*)  Glucose, Bld 112 (*)    All other components within normal limits  CBC WITH DIFFERENTIAL/PLATELET - Abnormal; Notable for the following components:   Neutro Abs 7.8 (*)    All other components within normal limits  URINALYSIS, ROUTINE W REFLEX MICROSCOPIC    EKG None  Radiology CT Renal Stone Study Result Date: 06/14/2024 EXAM: CT ABDOMEN AND PELVIS WITHOUT CONTRAST 06/14/2024 05:50:00 PM TECHNIQUE: CT of the abdomen and pelvis was performed without the administration of intravenous contrast. Multiplanar reformatted images are provided for review. Automated exposure control, iterative reconstruction, and/or weight-based adjustment of the mA/kV was utilized to reduce the  radiation dose to as low as reasonably achievable. COMPARISON: None available. CLINICAL HISTORY: Abdominal/flank pain, stone suspected; left flank pain. FINDINGS: LOWER CHEST: Likely small chronically loculated right pleural effusion. Elevation of the right hemidiaphragm with subsegmental atelectasis and scarring in the lung base. Dense multivessel coronary atherosclerosis. LIVER: The liver is unremarkable. GALLBLADDER AND BILE DUCTS: Gallbladder is unremarkable. No biliary ductal dilatation. SPLEEN: No acute abnormality. PANCREAS: No acute abnormality. ADRENAL GLANDS: No acute abnormality. KIDNEYS, URETERS AND BLADDER: No stones in the kidneys or ureters. No hydronephrosis. No perinephric or periureteral stranding. The urinary bladder is distended without focal abnormality. GI AND BOWEL: Stomach demonstrates no acute abnormality. Scattered total colonic diverticulosis. No changes of acute diverticulitis. The appendix was not visualized. No right lower quadrant or pericecal inflammatory stranding to suggest acute appendicitis. There is no bowel obstruction. PERITONEUM AND RETROPERITONEUM: No ascites. No free air. VASCULATURE: Aorta is normal in caliber. Diffuse aortoiliac atherosclerosis. Normal variant retroaortic left renal vein. LYMPH NODES: No lymphadenopathy. REPRODUCTIVE ORGANS: Mild prostatomegaly. BONES AND SOFT TISSUES: Multilevel degenerative disc disease of the spine. No acute osseous abnormality. No focal soft tissue abnormality. IMPRESSION: 1. No acute findings in the abdomen or pelvis. 2. Scattered total colonic diverticulosis. No changes of acute diverticulitis. Electronically signed by: Rogelia Myers MD MD 06/14/2024 07:03 PM EST RP Workstation: [id number]    Procedures Procedures    Medications Ordered in ED Medications  oxyCODONE -acetaminophen  (PERCOCET/ROXICET) 5-325 MG per tablet 1 tablet (1 tablet Oral Given 06/14/24 1730)  oxyCODONE -acetaminophen  (PERCOCET/ROXICET) 5-325 MG per tablet  1 tablet (1 tablet Oral Given 06/15/24 0410)  acetaminophen  (TYLENOL ) tablet 1,000 mg (1,000 mg Oral Given 06/15/24 1043)  methocarbamol  (ROBAXIN ) tablet 500 mg (500 mg Oral Given 06/15/24 1043)    ED Course/ Medical Decision Making/ A&P                          Medical Decision Making Amount and/or Complexity of Data Reviewed Radiology: ordered. Decision-making details documented in ED Course.  Risk OTC drugs. Prescription drug management.    This patient presents to the ED for concern of left flank, left lower back, radiating leg pain and numbness, this involves an extensive number of treatment options, and is a complaint that carries with it a high risk of complications and morbidity.  I considered the following differential and admission for this acute, potentially life threatening condition.  Patient is overall well-appearing though uncomfortable.  Hemodynamically stable albeit mildly hypertensive.  MDM:    Patient initially reports L flank pain that feels like a knot. he received a CT renal stone study from triage that showed no acute findings in the abdomen or pelvis.  Later on my interview he states that his left leg doesn't feel like his leg.  He does report a history of left foot drop and some atrophy of the  left calf indicating spinal cord pathology.  He reports numbness of his leg as well that is new for him.  With a history of negative CT renal stone study, symptoms initially were concerning for a possible acute spinal cord pathology or vascular pathology.no physical exam evidence of a DVT.  Compartment soft, no concern for compartment syndrome.  No deformities or erythema that would indicate trauma or cellulitis.  An MRI lumbar spine shows changes status post left laminectomy medial facetectomy at 3 4 and a questionable impingement of the right left for nerve in the lateral recess with mild to moderate left lateral recess and neural foraminal stenosis at L4-5.  His vascular ABI  was normal bilaterally.  Later on reevaluation patient describes his symptoms as left lower back pain and left buttock pain radiating down his leg.  I believe the patient's symptoms are much more consistent with sciatica versus lumbar radiculopathy.  He does not have any CVA tenderness, no abdominal or chest pain that would indicate pyelonephritis, ureterolithiasis or other intra-abdominal emergencies such as acute aortic syndrome, diverticulitis, appendicitis, gastritis/PUD.  Patient was given Tylenol , Robaxin , Percocet with some improvement in the emergency department.  He is prescribed Robaxin  500 mg twice daily, lidocaine  patch, and oxycodone  5 mg every 6 hours as needed x 3 days for pain.  He is advised to take MiraLAX  with the oxycodone  to avoid constipation.  Discussed with the patient that he will need to follow-up with a neurosurgeon or spine physician within the next 1 to 2 weeks for further evaluation and workup.  Given specific discharge instructions and return precautions, all questions answered to patient satisfaction.   Clinical Course as of 06/18/24 0756  Tue Jun 15, 2024  1032 No abdominal or chest pain. Numbness of the left leg that happened the other day, like the leg isn't mine. Went away for a bit, sometimes is okay but then worsens after he tries to walk 10-15 steps. Has had sciatica pain before and states this is different. Had several back operations 35 years ago. Has had left foot drop and some atrophy of the left calf as well.  [HN]  1342 MR LUMBAR SPINE WO CONTRAST 1. Status post left laminectomy and medial facetectomy at L3-4 with Modic type 2 endplate changes. 2. Moderate bilateral lateral recess stenosis at L3-4, more pronounced on the right, with questionable impingement of the right L4 nerve in the lateral recess. 3. Mild-to-moderate left lateral recess and neural foraminal stenosis at L4-5, without apparent nerve root impingement. 4. No significant spinal canal or  neural foraminal stenosis at L5-S1. Conus terminates at T12-L1 without abnormality identified.   [HN]  1428 Patient with still severe pain with ambulation. [HN]  1432 VAS US  ABI WITH/WO TBI Right: Resting right ankle-brachial index is within normal range. The right toe-brachial index is normal.   Left: Resting left ankle-brachial index is within normal range. The left toe-brachial index is abnormal.   [HN]    Clinical Course User Index [HN] Franklyn Sid SAILOR, MD    Labs: I Ordered, and personally interpreted labs.  The pertinent results include:  those listed above  Imaging Studies ordered: Renal stone study ordered in triage. I ordered imaging studies including MRI lumbar spine I independently visualized and interpreted imaging. I agree with the radiologist interpretation  Additional history obtained from chart review.    Reevaluation: After the interventions noted above, I reevaluated the patient and found that they have :improved  Social Determinants of Health: Lives independently  Disposition:  DC w/ discharge instructions/return precautions. All questions answered to patient's satisfaction.    Co morbidities that complicate the patient evaluation  Past Medical History:  Diagnosis Date   Kidney stones      Medicines Meds ordered this encounter  Medications   oxyCODONE -acetaminophen  (PERCOCET/ROXICET) 5-325 MG per tablet 1 tablet    Refill:  0   oxyCODONE -acetaminophen  (PERCOCET/ROXICET) 5-325 MG per tablet 1 tablet    Refill:  0    I have reviewed the patients home medicines and have made adjustments as needed  Problem List / ED Course: Problem List Items Addressed This Visit   None Visit Diagnoses       Lumbar radiculopathy, acute    -  Primary   Relevant Medications   methocarbamol  (ROBAXIN ) tablet 500 mg (Completed)   methocarbamol  (ROBAXIN ) 500 MG tablet     Acute left-sided low back pain with left-sided sciatica       Relevant Medications    oxyCODONE -acetaminophen  (PERCOCET/ROXICET) 5-325 MG per tablet 1 tablet (Completed)   oxyCODONE -acetaminophen  (PERCOCET/ROXICET) 5-325 MG per tablet 1 tablet (Completed)   acetaminophen  (TYLENOL ) tablet 1,000 mg (Completed)   methocarbamol  (ROBAXIN ) tablet 500 mg (Completed)   oxyCODONE  (ROXICODONE ) 5 MG immediate release tablet   methocarbamol  (ROBAXIN ) 500 MG tablet                   This note was created using dictation software, which may contain spelling or grammatical errors.    Franklyn Sid SAILOR, MD 06/18/24 [phone]  "

## 2024-06-15 NOTE — Progress Notes (Signed)
 Ankle Brachial Index has been completed.  Results can be found in chart review under CV Proc.  06/15/2024 2:31 PM  Rylea Selway Elden Appl, RVT.

## 2024-06-17 ENCOUNTER — Ambulatory Visit (INDEPENDENT_AMBULATORY_CARE_PROVIDER_SITE_OTHER): Admitting: Family Medicine

## 2024-06-17 ENCOUNTER — Encounter: Payer: Self-pay | Admitting: Family Medicine

## 2024-06-17 VITALS — BP 135/80 | HR 75 | Temp 97.9°F | Ht 69.0 in | Wt 237.1 lb

## 2024-06-17 DIAGNOSIS — M5416 Radiculopathy, lumbar region: Secondary | ICD-10-CM | POA: Insufficient documentation

## 2024-06-17 NOTE — Assessment & Plan Note (Signed)
 Some improvement since he was seen in the ED.  He has visit with neurosurgery this afternoon.  He may continue current medications for now.  FMLA paperwork completed.

## 2024-06-17 NOTE — Progress Notes (Signed)
 " Jake Huber - 69 y.o. male MRN [id number]  Date of birth: [birth year]  Subjective Chief Complaint  Patient presents with   Back Pain    HPI Jake Huber is a 69 y.o. male here today for follow up of recent hospital visit.   Seen in ED with complaint of left flank an back pain.  Pain is worse when walking for longer periods.  His leg feels heavy.  He has history of prior lumbar laminectomy.  CT of abdomen did not show stone.  MRI of lumbar spine did show moderate bilateral recess stenosis at L3-L4  with questionable R L4 nerve impingement.  Given oxycodone  in the ED with some improvement.  Currently using oxycodone  prn and robaxin . He has visit with neurosurgery this afternoon.    ROS:  A comprehensive ROS was completed and negative except as noted per HPI    Allergies[1]  Past Medical History:  Diagnosis Date   Kidney stones     Past Surgical History:  Procedure Laterality Date   APPENDECTOMY     BACK SURGERY      Social History   Socioeconomic History   Marital status: Divorced    Spouse name: Not on file   Number of children: Not on file   Years of education: Not on file   Highest education level: Associate degree: occupational, scientist, product/process development, or vocational program  Occupational History   Not on file  Tobacco Use   Smoking status: Former    Current packs/day: 0.00    Average packs/day: 1.5 packs/day for 25.0 years (37.5 ttl pk-yrs)    Types: Cigarettes    Start date: 06/03/1970    Quit date: 06/04/1995    Years since quitting: 29.0   Smokeless tobacco: Never  Vaping Use   Vaping status: Never Used  Substance and Sexual Activity   Alcohol use: Not Currently   Drug use: Not Currently   Sexual activity: Not Currently    Partners: Female  Other Topics Concern   Not on file  Social History Narrative   Not on file   Social Drivers of Health   Tobacco Use: Medium Risk (06/17/2024)   Patient History    Smoking Tobacco Use: Former    Smokeless Tobacco Use:  Never    Passive Exposure: Not on Actuary Strain: Low Risk (02/25/2024)   Overall Financial Resource Strain (CARDIA)    Difficulty of Paying Living Expenses: Not hard at all  Food Insecurity: No Food Insecurity (02/25/2024)   Epic    Worried About Radiation Protection Practitioner of Food in the Last Year: Never true    Ran Out of Food in the Last Year: Never true  Transportation Needs: No Transportation Needs (02/25/2024)   Epic    Lack of Transportation (Medical): No    Lack of Transportation (Non-Medical): No  Physical Activity: Sufficiently Active (02/25/2024)   Exercise Vital Sign    Days of Exercise per Week: 5 days    Minutes of Exercise per Session: 30 min  Stress: No Stress Concern Present (02/25/2024)   [employer] of Occupational Health - Occupational Stress Questionnaire    Feeling of Stress: Not at all  Social Connections: Socially Isolated (02/25/2024)   Social Connection and Isolation Panel    Frequency of Communication with Friends and Family: Never    Frequency of Social Gatherings with Friends and Family: Never    Attends Religious Services: Never    Database Administrator or Organizations: No    Attends  Club or Organization Meetings: Not on file    Marital Status: Divorced  Depression (PHQ2-9): Low Risk (06/17/2024)   Depression (PHQ2-9)    PHQ-2 Score: 0  Alcohol Screen: Low Risk (02/27/2024)   Alcohol Screen    Last Alcohol Screening Score (AUDIT): 1  Housing: Unknown (02/25/2024)   Epic    Unable to Pay for Housing in the Last Year: No    Number of Times Moved in the Last Year: Not on file    Homeless in the Last Year: No  Utilities: Low Risk (09/12/2023)   Received from [facility]   Utilities    In the past 12 months has the electric, gas, oil, or water company threatened to shut off services in your home? : No  Health Literacy: Adequate Health Literacy (02/27/2024)   B1300 Health Literacy    Frequency of need for help with medical instructions: Never     Family History  Problem Relation Age of Onset   Healthy Mother    Cancer Father    Testicular cancer Brother     Health Maintenance  Topic Date Due   Hepatitis C Screening  Never done   COVID-19 Vaccine (8 - 2025-26 season) 07/03/2024 (Originally 02/02/2024)   DTaP/Tdap/Td (3 - Td or Tdap) 08/30/2027   Colonoscopy  03/09/2033   Pneumococcal Vaccine: 50+ Years  Completed   Influenza Vaccine  Completed   Zoster Vaccines- Shingrix   Completed   Meningococcal B Vaccine  Aged Out     ----------------------------------------------------------------------------------------------------------------------------------------------------------------------------------------------------------------- Physical Exam BP 135/80   Pulse 75   Temp 97.9 F (36.6 C) (Oral)   Ht 5' 9 (1.753 m)   Wt 237 lb 1.9 oz (107.6 kg)   SpO2 95%   BMI 35.02 kg/m   Physical Exam Constitutional:      Appearance: Normal appearance.  Eyes:     General: No scleral icterus. Cardiovascular:     Rate and Rhythm: Normal rate and regular rhythm.  Pulmonary:     Effort: Pulmonary effort is normal.     Breath sounds: Normal breath sounds.  Musculoskeletal:     Comments: Muscle tightness along L lumbar area.    Neurological:     Mental Status: He is alert.  Psychiatric:        Mood and Affect: Mood normal.        Behavior: Behavior normal.     ------------------------------------------------------------------------------------------------------------------------------------------------------------------------------------------------------------------- Assessment and Plan  Lumbar radiculopathy Some improvement since he was seen in the ED.  He has visit with neurosurgery this afternoon.  He may continue current medications for now.  FMLA paperwork completed.     No orders of the defined types were placed in this encounter.   No follow-ups on file.        [1] No Known Allergies  "
